# Patient Record
Sex: Female | Born: 1940 | Race: White | Hispanic: No | Smoking: Former smoker
Health system: Southern US, Community
[De-identification: ages and names within clinical notes are randomized; demographics above are authoritative.]

## PROBLEM LIST (undated history)

## (undated) DIAGNOSIS — M1990 Unspecified osteoarthritis, unspecified site: Secondary | ICD-10-CM

## (undated) DIAGNOSIS — T4145XA Adverse effect of unspecified anesthetic, initial encounter: Secondary | ICD-10-CM

## (undated) DIAGNOSIS — K219 Gastro-esophageal reflux disease without esophagitis: Secondary | ICD-10-CM

## (undated) DIAGNOSIS — C801 Malignant (primary) neoplasm, unspecified: Secondary | ICD-10-CM

## (undated) DIAGNOSIS — T8859XA Other complications of anesthesia, initial encounter: Secondary | ICD-10-CM

## (undated) DIAGNOSIS — J189 Pneumonia, unspecified organism: Secondary | ICD-10-CM

## (undated) HISTORY — PX: COLONOSCOPY: SHX174

## (undated) HISTORY — PX: COLONOSCOPY W/ BIOPSIES AND POLYPECTOMY: SHX1376

## (undated) HISTORY — PX: ABDOMINAL HYSTERECTOMY: SHX81

## (undated) HISTORY — PX: LOBECTOMY: SHX5089

## (undated) HISTORY — PX: JOINT REPLACEMENT: SHX530

## (undated) HISTORY — PX: EYE SURGERY: SHX253

## (undated) HISTORY — PX: OTHER SURGICAL HISTORY: SHX169

## (undated) HISTORY — PX: TONSILLECTOMY: SUR1361

---

## 1994-10-03 HISTORY — PX: ABDOMINAL HYSTERECTOMY: SHX81

## 2013-10-09 ENCOUNTER — Other Ambulatory Visit (HOSPITAL_COMMUNITY): Payer: Self-pay | Admitting: Orthopaedic Surgery

## 2013-10-09 ENCOUNTER — Encounter (HOSPITAL_COMMUNITY): Payer: Self-pay | Admitting: *Deleted

## 2013-10-10 ENCOUNTER — Encounter (HOSPITAL_COMMUNITY): Payer: Self-pay | Admitting: Pharmacy Technician

## 2013-10-10 NOTE — Progress Notes (Signed)
SAMEDAY PREOP INSTRUCTIONS AND CHLORHEXIDINE INSTRUCTIONS / PRECAUTIONS DISCUSSED WITH PT BY PHONE.

## 2013-10-11 ENCOUNTER — Inpatient Hospital Stay (HOSPITAL_COMMUNITY): Payer: Medicare Other

## 2013-10-11 ENCOUNTER — Inpatient Hospital Stay (HOSPITAL_COMMUNITY)
Admission: RE | Admit: 2013-10-11 | Discharge: 2013-10-14 | DRG: 470 | Disposition: A | Discharge status: Home Health | Payer: Medicare Other | Source: Ambulatory Visit | Attending: Orthopaedic Surgery | Admitting: Orthopaedic Surgery

## 2013-10-11 ENCOUNTER — Encounter (HOSPITAL_COMMUNITY): Payer: Self-pay | Admitting: *Deleted

## 2013-10-11 ENCOUNTER — Inpatient Hospital Stay (HOSPITAL_COMMUNITY): Payer: Medicare Other | Admitting: Anesthesiology

## 2013-10-11 ENCOUNTER — Encounter (HOSPITAL_COMMUNITY): Payer: Medicare Other | Admitting: Anesthesiology

## 2013-10-11 ENCOUNTER — Encounter (HOSPITAL_COMMUNITY)
Admission: RE | Disposition: A | Discharge status: Home Health | Payer: Self-pay | Source: Ambulatory Visit | Attending: Orthopaedic Surgery

## 2013-10-11 DIAGNOSIS — M879 Osteonecrosis, unspecified: Secondary | ICD-10-CM

## 2013-10-11 DIAGNOSIS — K219 Gastro-esophageal reflux disease without esophagitis: Secondary | ICD-10-CM | POA: Diagnosis present

## 2013-10-11 DIAGNOSIS — Z87891 Personal history of nicotine dependence: Secondary | ICD-10-CM

## 2013-10-11 DIAGNOSIS — M169 Osteoarthritis of hip, unspecified: Principal | ICD-10-CM | POA: Diagnosis present

## 2013-10-11 DIAGNOSIS — Z96649 Presence of unspecified artificial hip joint: Secondary | ICD-10-CM

## 2013-10-11 DIAGNOSIS — M1610 Unilateral primary osteoarthritis, unspecified hip: Principal | ICD-10-CM | POA: Diagnosis present

## 2013-10-11 DIAGNOSIS — M87059 Idiopathic aseptic necrosis of unspecified femur: Secondary | ICD-10-CM | POA: Diagnosis present

## 2013-10-11 DIAGNOSIS — Z85118 Personal history of other malignant neoplasm of bronchus and lung: Secondary | ICD-10-CM

## 2013-10-11 DIAGNOSIS — D62 Acute posthemorrhagic anemia: Secondary | ICD-10-CM | POA: Diagnosis not present

## 2013-10-11 DIAGNOSIS — Z9221 Personal history of antineoplastic chemotherapy: Secondary | ICD-10-CM

## 2013-10-11 DIAGNOSIS — D1739 Benign lipomatous neoplasm of skin and subcutaneous tissue of other sites: Secondary | ICD-10-CM | POA: Diagnosis present

## 2013-10-11 HISTORY — DX: Malignant (primary) neoplasm, unspecified: C80.1

## 2013-10-11 HISTORY — DX: Unspecified osteoarthritis, unspecified site: M19.90

## 2013-10-11 HISTORY — DX: Gastro-esophageal reflux disease without esophagitis: K21.9

## 2013-10-11 HISTORY — DX: Pneumonia, unspecified organism: J18.9

## 2013-10-11 HISTORY — PX: TOTAL HIP ARTHROPLASTY: SHX124

## 2013-10-11 LAB — SURGICAL PCR SCREEN
MRSA, PCR: NEGATIVE
Staphylococcus aureus: NEGATIVE

## 2013-10-11 LAB — URINALYSIS, ROUTINE W REFLEX MICROSCOPIC
BILIRUBIN URINE: NEGATIVE
Glucose, UA: NEGATIVE mg/dL
Ketones, ur: NEGATIVE mg/dL
Nitrite: NEGATIVE
PH: 7 (ref 5.0–8.0)
Protein, ur: NEGATIVE mg/dL
Specific Gravity, Urine: 1.017 (ref 1.005–1.030)
Urobilinogen, UA: 0.2 mg/dL (ref 0.0–1.0)

## 2013-10-11 LAB — BASIC METABOLIC PANEL
BUN: 13 mg/dL (ref 6–23)
CO2: 28 mEq/L (ref 19–32)
Calcium: 9.5 mg/dL (ref 8.4–10.5)
Chloride: 100 mEq/L (ref 96–112)
Creatinine, Ser: 0.75 mg/dL (ref 0.50–1.10)
GFR calc Af Amer: >90 mL/min (ref >90)
GFR calc non Af Amer: 81 mL/min — ABNORMAL LOW (ref >90)
Glucose, Bld: 92 mg/dL (ref 70–99)
POTASSIUM: 3.8 meq/L (ref 3.7–5.3)
Sodium: 139 mEq/L (ref 137–147)

## 2013-10-11 LAB — APTT: APTT: 31 s (ref 24–37)

## 2013-10-11 LAB — CBC
HEMATOCRIT: 36.3 % (ref 36.0–46.0)
Hemoglobin: 12 g/dL (ref 12.0–15.0)
MCH: 31.9 pg (ref 26.0–34.0)
MCHC: 33.1 g/dL (ref 30.0–36.0)
MCV: 96.5 fL (ref 78.0–100.0)
Platelets: 250 10*3/uL (ref 150–400)
RBC: 3.76 MIL/uL — ABNORMAL LOW (ref 3.87–5.11)
RDW: 16.9 % — ABNORMAL HIGH (ref 11.5–15.5)
WBC: 8.5 10*3/uL (ref 4.0–10.5)

## 2013-10-11 LAB — TYPE AND SCREEN
ABO/RH(D): A POS
Antibody Screen: NEGATIVE

## 2013-10-11 LAB — ABO/RH: ABO/RH(D): A POS

## 2013-10-11 LAB — URINE MICROSCOPIC-ADD ON

## 2013-10-11 LAB — PROTIME-INR
INR: 1.12 (ref 0.00–1.49)
Prothrombin Time: 14.2 seconds (ref 11.6–15.2)

## 2013-10-11 SURGERY — ARTHROPLASTY, HIP, TOTAL, ANTERIOR APPROACH
Anesthesia: Spinal | Site: Hip | Laterality: Right

## 2013-10-11 MED ORDER — DIPHENHYDRAMINE HCL 12.5 MG/5ML PO ELIX: 12.5000 mg | ORAL_SOLUTION | ORAL | Status: DC | PRN

## 2013-10-11 MED ORDER — LACTATED RINGERS IV SOLN
INTRAVENOUS | Status: DC | PRN
  Administered 2013-10-11 (×2): via INTRAVENOUS

## 2013-10-11 MED ORDER — ONDANSETRON HCL 4 MG/2ML IJ SOLN
INTRAMUSCULAR | Status: AC
  Filled 2013-10-11: qty 2

## 2013-10-11 MED ORDER — PROMETHAZINE HCL 25 MG/ML IJ SOLN: 6.2500 mg | INTRAMUSCULAR | Status: DC | PRN

## 2013-10-11 MED ORDER — EPHEDRINE SULFATE 50 MG/ML IJ SOLN
INTRAMUSCULAR | Status: AC
  Filled 2013-10-11: qty 1

## 2013-10-11 MED ORDER — CEFAZOLIN SODIUM-DEXTROSE 2-3 GM-% IV SOLR
INTRAVENOUS | Status: AC
  Filled 2013-10-11: qty 50

## 2013-10-11 MED ORDER — PROPOFOL 10 MG/ML IV BOLUS
INTRAVENOUS | Status: AC
  Filled 2013-10-11: qty 20

## 2013-10-11 MED ORDER — CIPROFLOXACIN IN D5W 400 MG/200ML IV SOLN
INTRAVENOUS | Status: AC
  Filled 2013-10-11: qty 200

## 2013-10-11 MED ORDER — ALUM & MAG HYDROXIDE-SIMETH 200-200-20 MG/5ML PO SUSP: 30.0000 mL | ORAL | Status: DC | PRN

## 2013-10-11 MED ORDER — ZOLPIDEM TARTRATE 5 MG PO TABS: 5.0000 mg | ORAL_TABLET | Freq: Every evening | ORAL | Status: DC | PRN

## 2013-10-11 MED ORDER — METHOCARBAMOL 500 MG PO TABS
500.0000 mg | ORAL_TABLET | Freq: Four times a day (QID) | ORAL | Status: DC | PRN
  Administered 2013-10-12 – 2013-10-13 (×4): 500 mg via ORAL
  Filled 2013-10-11 (×5): qty 1

## 2013-10-11 MED ORDER — OXYCODONE HCL 5 MG/5ML PO SOLN: 5.0000 mg | Freq: Once | ORAL | Status: DC | PRN

## 2013-10-11 MED ORDER — BUPIVACAINE HCL (PF) 0.5 % IJ SOLN
INTRAMUSCULAR | Status: AC
  Filled 2013-10-11: qty 30

## 2013-10-11 MED ORDER — FENTANYL CITRATE 0.05 MG/ML IJ SOLN
INTRAMUSCULAR | Status: AC
  Filled 2013-10-11: qty 2

## 2013-10-11 MED ORDER — LEVOFLOXACIN 500 MG PO TABS
500.0000 mg | ORAL_TABLET | Freq: Every day | ORAL | Status: DC
  Administered 2013-10-11 – 2013-10-14 (×4): 500 mg via ORAL
  Filled 2013-10-11 (×5): qty 1

## 2013-10-11 MED ORDER — LACTATED RINGERS IV SOLN
INTRAVENOUS | Status: DC
  Administered 2013-10-11: 1 via INTRAVENOUS

## 2013-10-11 MED ORDER — BUPIVACAINE HCL (PF) 0.5 % IJ SOLN
INTRAMUSCULAR | Status: DC | PRN
  Administered 2013-10-11: 3 mL

## 2013-10-11 MED ORDER — MEPERIDINE HCL 50 MG/ML IJ SOLN: 6.2500 mg | INTRAMUSCULAR | Status: DC | PRN

## 2013-10-11 MED ORDER — PHENOL 1.4 % MT LIQD
1.0000 | OROMUCOSAL | Status: DC | PRN
  Filled 2013-10-11: qty 177

## 2013-10-11 MED ORDER — ACETAMINOPHEN 650 MG RE SUPP: 650.0000 mg | Freq: Four times a day (QID) | RECTAL | Status: DC | PRN

## 2013-10-11 MED ORDER — EPHEDRINE SULFATE 50 MG/ML IJ SOLN
INTRAMUSCULAR | Status: DC | PRN
  Administered 2013-10-11 (×2): 5 mg via INTRAVENOUS

## 2013-10-11 MED ORDER — ONDANSETRON HCL 4 MG PO TABS: 4.0000 mg | ORAL_TABLET | Freq: Four times a day (QID) | ORAL | Status: DC | PRN

## 2013-10-11 MED ORDER — SODIUM CHLORIDE 0.9 % IR SOLN
Status: DC | PRN
  Administered 2013-10-11: 1000 mL

## 2013-10-11 MED ORDER — POLYETHYLENE GLYCOL 3350 17 G PO PACK
17.0000 g | PACK | Freq: Every day | ORAL | Status: DC | PRN
Start: 2013-10-11 — End: 2013-10-14
  Administered 2013-10-12: 07:00:00 17 g via ORAL

## 2013-10-11 MED ORDER — CEFAZOLIN SODIUM-DEXTROSE 2-3 GM-% IV SOLR
2.0000 g | INTRAVENOUS | Status: AC
  Administered 2013-10-11: 2 g via INTRAVENOUS

## 2013-10-11 MED ORDER — METOCLOPRAMIDE HCL 10 MG PO TABS: 5.0000 mg | ORAL_TABLET | Freq: Three times a day (TID) | ORAL | Status: DC | PRN

## 2013-10-11 MED ORDER — CEFAZOLIN SODIUM 1-5 GM-% IV SOLN
1.0000 g | Freq: Four times a day (QID) | INTRAVENOUS | Status: AC
  Administered 2013-10-11 – 2013-10-12 (×2): 1 g via INTRAVENOUS
  Filled 2013-10-11 (×2): qty 50

## 2013-10-11 MED ORDER — OXYCODONE HCL ER 20 MG PO T12A
20.0000 mg | EXTENDED_RELEASE_TABLET | Freq: Two times a day (BID) | ORAL | Status: DC
  Administered 2013-10-11 – 2013-10-14 (×6): 20 mg via ORAL
  Filled 2013-10-11 (×6): qty 1

## 2013-10-11 MED ORDER — OXYCODONE HCL 5 MG PO TABS: 5.0000 mg | ORAL_TABLET | Freq: Once | ORAL | Status: DC | PRN

## 2013-10-11 MED ORDER — HYDROMORPHONE HCL PF 1 MG/ML IJ SOLN
1.0000 mg | INTRAMUSCULAR | Status: DC | PRN
  Administered 2013-10-11 – 2013-10-12 (×2): 1 mg via INTRAVENOUS
  Filled 2013-10-11 (×2): qty 1

## 2013-10-11 MED ORDER — TRANEXAMIC ACID 100 MG/ML IV SOLN
1000.0000 mg | INTRAVENOUS | Status: AC
  Administered 2013-10-11: 1000 mg via INTRAVENOUS
  Filled 2013-10-11: qty 10

## 2013-10-11 MED ORDER — CIPROFLOXACIN IN D5W 400 MG/200ML IV SOLN
INTRAVENOUS | Status: DC | PRN
  Administered 2013-10-11: 400 mg via INTRAVENOUS

## 2013-10-11 MED ORDER — ONDANSETRON HCL 4 MG/2ML IJ SOLN: 4.0000 mg | Freq: Four times a day (QID) | INTRAMUSCULAR | Status: DC | PRN

## 2013-10-11 MED ORDER — ASPIRIN EC 325 MG PO TBEC
325.0000 mg | DELAYED_RELEASE_TABLET | Freq: Two times a day (BID) | ORAL | Status: DC
  Administered 2013-10-12 – 2013-10-14 (×5): 325 mg via ORAL
  Filled 2013-10-11 (×7): qty 1

## 2013-10-11 MED ORDER — HYDROMORPHONE HCL PF 1 MG/ML IJ SOLN: 0.2500 mg | INTRAMUSCULAR | Status: DC | PRN

## 2013-10-11 MED ORDER — SODIUM CHLORIDE 0.9 % IJ SOLN
INTRAMUSCULAR | Status: AC
  Filled 2013-10-11: qty 10

## 2013-10-11 MED ORDER — OXYCODONE HCL 5 MG PO TABS
5.0000 mg | ORAL_TABLET | ORAL | Status: DC | PRN
  Administered 2013-10-11: 5 mg via ORAL
  Administered 2013-10-11 – 2013-10-13 (×4): 10 mg via ORAL
  Administered 2013-10-14: 5 mg via ORAL
  Filled 2013-10-11: qty 1
  Filled 2013-10-11 (×3): qty 2
  Filled 2013-10-11: qty 1
  Filled 2013-10-11: qty 2

## 2013-10-11 MED ORDER — METHOCARBAMOL 100 MG/ML IJ SOLN
500.0000 mg | Freq: Four times a day (QID) | INTRAVENOUS | Status: DC | PRN
  Administered 2013-10-11: 500 mg via INTRAVENOUS
  Filled 2013-10-11: qty 5

## 2013-10-11 MED ORDER — PROPOFOL INFUSION 10 MG/ML OPTIME
INTRAVENOUS | Status: DC | PRN
  Administered 2013-10-11: 75 ug/kg/min via INTRAVENOUS

## 2013-10-11 MED ORDER — ONDANSETRON HCL 4 MG/2ML IJ SOLN
INTRAMUSCULAR | Status: DC | PRN
  Administered 2013-10-11: 4 mg via INTRAVENOUS

## 2013-10-11 MED ORDER — METOCLOPRAMIDE HCL 5 MG/ML IJ SOLN: 5.0000 mg | Freq: Three times a day (TID) | INTRAMUSCULAR | Status: DC | PRN

## 2013-10-11 MED ORDER — ACETAMINOPHEN 325 MG PO TABS
650.0000 mg | ORAL_TABLET | Freq: Four times a day (QID) | ORAL | Status: DC | PRN
Start: 2013-10-11 — End: 2013-10-14
  Administered 2013-10-12: 650 mg via ORAL
  Filled 2013-10-11: qty 2

## 2013-10-11 MED ORDER — SODIUM CHLORIDE 0.9 % IV SOLN
INTRAVENOUS | Status: DC
Start: 2013-10-11 — End: 2013-10-13
  Administered 2013-10-12: 07:00:00 via INTRAVENOUS

## 2013-10-11 MED ORDER — MUPIROCIN 2 % EX OINT
TOPICAL_OINTMENT | Freq: Two times a day (BID) | CUTANEOUS | Status: DC
  Administered 2013-10-11: 1 via NASAL
  Filled 2013-10-11: qty 22

## 2013-10-11 MED ORDER — FENTANYL CITRATE 0.05 MG/ML IJ SOLN
INTRAMUSCULAR | Status: DC | PRN
  Administered 2013-10-11: 50 ug via INTRAVENOUS

## 2013-10-11 MED ORDER — DOCUSATE SODIUM 100 MG PO CAPS
100.0000 mg | ORAL_CAPSULE | Freq: Two times a day (BID) | ORAL | Status: DC
  Administered 2013-10-11 – 2013-10-14 (×6): 100 mg via ORAL

## 2013-10-11 MED ORDER — PROPOFOL 10 MG/ML IV BOLUS
INTRAVENOUS | Status: DC | PRN
  Administered 2013-10-11: 10 mg via INTRAVENOUS
  Administered 2013-10-11: 20 mg via INTRAVENOUS

## 2013-10-11 MED ORDER — MENTHOL 3 MG MT LOZG
1.0000 | LOZENGE | OROMUCOSAL | Status: DC | PRN
  Filled 2013-10-11: qty 9

## 2013-10-11 SURGICAL SUPPLY — 43 items
BAG ZIPLOCK 12X15 (MISCELLANEOUS) IMPLANT
BENZOIN TINCTURE PRP APPL 2/3 (GAUZE/BANDAGES/DRESSINGS) ×3 IMPLANT
BLADE SAW SGTL 18X1.27X75 (BLADE) ×2 IMPLANT
BLADE SAW SGTL 18X1.27X75MM (BLADE) ×1
CAPT HIP PF COP ×3 IMPLANT
CELLS DAT CNTRL 66122 CELL SVR (MISCELLANEOUS) ×1 IMPLANT
CLOSURE WOUND 1/2 X4 (GAUZE/BANDAGES/DRESSINGS) ×1
DERMABOND ADVANCED (GAUZE/BANDAGES/DRESSINGS)
DERMABOND ADVANCED .7 DNX12 (GAUZE/BANDAGES/DRESSINGS) IMPLANT
DRAPE C-ARM 42X120 X-RAY (DRAPES) ×3 IMPLANT
DRAPE STERI IOBAN 125X83 (DRAPES) ×3 IMPLANT
DRAPE U-SHAPE 47X51 STRL (DRAPES) ×9 IMPLANT
DRSG AQUACEL AG ADV 3.5X10 (GAUZE/BANDAGES/DRESSINGS) ×3 IMPLANT
DURAPREP 26ML APPLICATOR (WOUND CARE) ×3 IMPLANT
ELECT BLADE TIP CTD 4 INCH (ELECTRODE) ×3 IMPLANT
ELECT REM PT RETURN 9FT ADLT (ELECTROSURGICAL) ×3
ELECTRODE REM PT RTRN 9FT ADLT (ELECTROSURGICAL) ×1 IMPLANT
FACESHIELD LNG OPTICON STERILE (SAFETY) ×12 IMPLANT
GAUZE XEROFORM 5X9 LF (GAUZE/BANDAGES/DRESSINGS) IMPLANT
GLOVE BIO SURGEON STRL SZ7.5 (GLOVE) ×18 IMPLANT
GLOVE BIOGEL PI IND STRL 8 (GLOVE) ×2 IMPLANT
GLOVE BIOGEL PI INDICATOR 8 (GLOVE) ×4
GLOVE ECLIPSE 8.0 STRL XLNG CF (GLOVE) ×3 IMPLANT
GOWN STRL REUS W/TWL XL LVL3 (GOWN DISPOSABLE) ×15 IMPLANT
HANDPIECE INTERPULSE COAX TIP (DISPOSABLE) ×2
KIT BASIN OR (CUSTOM PROCEDURE TRAY) ×3 IMPLANT
PACK TOTAL JOINT (CUSTOM PROCEDURE TRAY) ×3 IMPLANT
PADDING CAST COTTON 6X4 STRL (CAST SUPPLIES) ×3 IMPLANT
RTRCTR WOUND ALEXIS 18CM MED (MISCELLANEOUS) ×3
SCREW PINN CAN 6.5X20 (Screw) IMPLANT
SET HNDPC FAN SPRY TIP SCT (DISPOSABLE) ×1 IMPLANT
STAPLER VISISTAT 35W (STAPLE) IMPLANT
STRIP CLOSURE SKIN 1/2X4 (GAUZE/BANDAGES/DRESSINGS) ×2 IMPLANT
SUT ETHIBOND NAB CT1 #1 30IN (SUTURE) ×3 IMPLANT
SUT ETHILON 3 0 PS 1 (SUTURE) IMPLANT
SUT MNCRL AB 4-0 PS2 18 (SUTURE) ×3 IMPLANT
SUT VIC AB 0 CT1 36 (SUTURE) ×3 IMPLANT
SUT VIC AB 1 CT1 36 (SUTURE) ×3 IMPLANT
SUT VIC AB 2-0 CT1 27 (SUTURE) ×4
SUT VIC AB 2-0 CT1 TAPERPNT 27 (SUTURE) ×2 IMPLANT
TOWEL OR 17X26 10 PK STRL BLUE (TOWEL DISPOSABLE) ×3 IMPLANT
TOWEL OR NON WOVEN STRL DISP B (DISPOSABLE) ×3 IMPLANT
TRAY FOLEY CATH 14FRSI W/METER (CATHETERS) ×3 IMPLANT

## 2013-10-11 NOTE — Anesthesia Procedure Notes (Addendum)
Spinal  Patient location during procedure: OR Start time: 10/11/2013 1:35 PM End time: 10/11/2013 1:41 PM Staffing CRNA/Resident: Anne Fu Performed by: resident/CRNA  Preanesthetic Checklist Completed: patient identified, site marked, surgical consent, pre-op evaluation, timeout performed, IV checked, risks and benefits discussed and monitors and equipment checked Spinal Block Patient position: sitting Prep: Betadine Patient monitoring: heart rate, continuous pulse ox and blood pressure Approach: right paramedian Location: L2-3 Injection technique: single-shot Needle Needle type: Spinocan  Needle gauge: 22 G Needle length: 9 cm Assessment Sensory level: T4 Additional Notes Expiration date of kit checked and confirmed. Patient tolerated procedure well, without complications. X1 Attempt with noted clear CSF return, easy administration of medication.  Noted T-4 level on exam prior to procedure.

## 2013-10-11 NOTE — Transfer of Care (Signed)
Immediate Anesthesia Transfer of Care Note  Patient: Lisa Sherman  Procedure(s) Performed: Procedure(s) (LRB): RIGHT TOTAL HIP ARTHROPLASTY ANTERIOR APPROACH (Right)  Patient Location: PACU  Anesthesia Type: Spinal  Level of Consciousness: sedated, patient cooperative and responds to stimulation  Airway & Oxygen Therapy: Patient Spontanous Breathing and Patient connected to face mask oxgen  Post-op Assessment: Report given to PACU RN and Post -op Vital signs reviewed and stable  Post vital signs: Reviewed and stable  Complications: No apparent anesthesia complications

## 2013-10-11 NOTE — H&P (Signed)
TOTAL HIP ADMISSION H&P  Patient is admitted for right total hip arthroplasty.  Subjective:  Chief Complaint: right hip pain  HPI: Lisa Sherman, 73 y.o. female, has a history of pain and functional disability in the right hip(s) due to arthritis and patient has failed non-surgical conservative treatments for greater than 12 weeks to include NSAID's and/or analgesics, supervised PT with diminished ADL's post treatment, use of assistive devices and activity modification.  Onset of symptoms was abrupt starting 1 years ago with rapidlly worsening course since that time.The patient noted no past surgery on the right hip(s).  Patient currently rates pain in the right hip at 10 out of 10 with activity. Patient has night pain, worsening of pain with activity and weight bearing, trendelenberg gait, pain that interfers with activities of daily living and pain with passive range of motion. Patient has evidence of subchondral cysts, subchondral sclerosis, periarticular osteophytes and joint space narrowing by imaging studies. This condition presents safety issues increasing the risk of falls.  There is no current active infection.  Patient Active Problem List   Diagnosis Date Noted  . Osteonecrosis of right hip 10/11/2013   Past Medical History  Diagnosis Date  . Cancer     hx of lung cancer - surgery - chemo - radiation- all completed july 2009  . Pneumonia     yrs ago  . GERD (gastroesophageal reflux disease)   . Arthritis     severe oa right hip; also has arthritis both shoulders-limited rom.    Past Surgical History  Procedure Laterality Date  . Removal upper right lobe lung for cancer -2009 - surgery at [facility]    . 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 Abdominal hysterectomy  1996    No prescriptions prior to admission   No Known Allergies  History  Substance Use Topics  . Smoking status: Former Research scientist (life sciences)  . Smokeless tobacco: Never Used  . Alcohol  Use: Yes     Comment: quit smoking 1975  occas alcohol    History reviewed. No pertinent family history.   Review of Systems  Musculoskeletal: Positive for joint pain.  All other systems reviewed and are negative.    Objective:  Physical Exam  Constitutional: She is oriented to person, place, and time. She appears well-developed and well-nourished.  HENT:  Head: Normocephalic and atraumatic.  Eyes: EOM are normal. Pupils are equal, round, and reactive to light.  Neck: Normal range of motion. Neck supple.  Cardiovascular: Normal rate and regular rhythm.   Respiratory: Effort normal and breath sounds normal.  GI: Soft. Bowel sounds are normal.  Musculoskeletal:       Right hip: She exhibits decreased range of motion, decreased strength and bony tenderness.  Neurological: She is alert and oriented to person, place, and time.  Skin: Skin is warm and dry.  Psychiatric: She has a normal mood and affect.    Vital signs in last 24 hours:    Labs:   There is no height or weight on file to calculate BMI.   Imaging Review Plain radiographs demonstrate severe degenerative joint disease of the right hip(s). The bone quality appears to be good for age and reported activity level.  Assessment/Plan:  End stage arthritis, right hip(s)  The patient history, physical examination, clinical judgement of the provider and imaging studies are consistent with end stage degenerative joint disease of the right hip(s) and total hip arthroplasty is deemed  medically necessary. The treatment options including medical management, injection therapy, arthroscopy and arthroplasty were discussed at length. The risks and benefits of total hip arthroplasty were presented and reviewed. The risks due to aseptic loosening, infection, stiffness, dislocation/subluxation,  thromboembolic complications and other imponderables were discussed.  The patient acknowledged the explanation, agreed to proceed with the plan  and consent was signed. Patient is being admitted for inpatient treatment for surgery, pain control, PT, OT, prophylactic antibiotics, VTE prophylaxis, progressive ambulation and ADL's and discharge planning.The patient is planning to be discharged home with home health services

## 2013-10-11 NOTE — Preoperative (Signed)
Beta Blockers   Reason not to administer Beta Blockers:Not Applicable 

## 2013-10-11 NOTE — Brief Op Note (Signed)
10/11/2013  3:48 PM  PATIENT:  Jamse Arn  73 y.o. female  PRE-OPERATIVE DIAGNOSIS:  Severe osteoarthritis right hip  POST-OPERATIVE DIAGNOSIS:  severe osteoarthritis right hip  PROCEDURE:  Procedure(s): RIGHT TOTAL HIP ARTHROPLASTY ANTERIOR APPROACH (Right)  SURGEON:  Surgeon(s) and Role:    * Mcarthur Rossetti, MD - Primary  PHYSICIAN ASSISTANT: Benita Stabile, PA-C  ANESTHESIA:   spinal  EBL:  Total I/O In: 1800 [I.V.:1800] Out: 750 [Urine:400; Blood:350]  BLOOD ADMINISTERED:none  DRAINS: none   LOCAL MEDICATIONS USED:  NONE  SPECIMEN:  Excision  DISPOSITION OF SPECIMEN:  PATHOLOGY  COUNTS: yes  TOURNIQUET:  * No tourniquets in log *  DICTATION: .Other Dictation: Dictation Number [id number]  PLAN OF CARE: Admit to inpatient   PATIENT DISPOSITION:  PACU - hemodynamically stable.   Delay start of Pharmacological VTE agent (>24hrs) due to surgical blood loss or risk of bleeding: no

## 2013-10-11 NOTE — Anesthesia Preprocedure Evaluation (Signed)
Anesthesia Evaluation  Patient identified by MRN, date of birth, ID band Patient awake    Reviewed: Allergy & Precautions, H&P , NPO status , Patient's Chart, lab work & pertinent test results  Airway Mallampati: II TM Distance: >3 FB Neck ROM: Full    Dental  (+) Dental Advisory Given   Pulmonary pneumonia -, resolved, former smoker,  breath sounds clear to auscultation        Cardiovascular negative cardio ROS  Rhythm:Regular Rate:Normal     Neuro/Psych negative neurological ROS  negative psych ROS   GI/Hepatic Neg liver ROS, GERD-  ,  Endo/Other  negative endocrine ROS  Renal/GU negative Renal ROS     Musculoskeletal negative musculoskeletal ROS (+)   Abdominal   Peds  Hematology negative hematology ROS (+)   Anesthesia Other Findings   Reproductive/Obstetrics negative OB ROS                           Anesthesia Physical Anesthesia Plan  ASA: II  Anesthesia Plan: Spinal   Post-op Pain Management:    Induction:   Airway Management Planned:   Additional Equipment:   Intra-op Plan:   Post-operative Plan:   Informed Consent: I have reviewed the patients History and Physical, chart, labs and discussed the procedure including the risks, benefits and alternatives for the proposed anesthesia with the patient or authorized representative who has indicated his/her understanding and acceptance.   Dental advisory given  Plan Discussed with: CRNA  Anesthesia Plan Comments:         Anesthesia Quick Evaluation

## 2013-10-11 NOTE — Progress Notes (Signed)
Patient took [place] 10/10/13 for UTI

## 2013-10-12 LAB — URINE CULTURE
CULTURE: NO GROWTH
Colony Count: NO GROWTH

## 2013-10-12 LAB — BASIC METABOLIC PANEL
BUN: 11 mg/dL (ref 6–23)
CHLORIDE: 101 meq/L (ref 96–112)
CO2: 27 mEq/L (ref 19–32)
Calcium: 8.3 mg/dL — ABNORMAL LOW (ref 8.4–10.5)
Creatinine, Ser: 0.7 mg/dL (ref 0.50–1.10)
GFR calc non Af Amer: 83 mL/min — ABNORMAL LOW (ref >90)
Glucose, Bld: 113 mg/dL — ABNORMAL HIGH (ref 70–99)
Potassium: 3.7 mEq/L (ref 3.7–5.3)
Sodium: 138 mEq/L (ref 137–147)

## 2013-10-12 LAB — CBC
HCT: 31.3 % — ABNORMAL LOW (ref 36.0–46.0)
Hemoglobin: 10 g/dL — ABNORMAL LOW (ref 12.0–15.0)
MCH: 31.4 pg (ref 26.0–34.0)
MCHC: 31.9 g/dL (ref 30.0–36.0)
MCV: 98.4 fL (ref 78.0–100.0)
PLATELETS: 201 10*3/uL (ref 150–400)
RBC: 3.18 MIL/uL — ABNORMAL LOW (ref 3.87–5.11)
RDW: 16.6 % — ABNORMAL HIGH (ref 11.5–15.5)
WBC: 9.9 10*3/uL (ref 4.0–10.5)

## 2013-10-12 NOTE — Op Note (Signed)
Lisa Sherman, Lisa Sherman                ACCOUNT NO.:  [id number]  MEDICAL RECORD NO.:  [id number]  LOCATION:  71                         FACILITY:  [facility]  PHYSICIAN:  Lind Guest. Ninfa Linden, M.D.DATE OF BIRTH:  April 08, 1941  DATE OF PROCEDURE:  10/11/2013 DATE OF DISCHARGE:                              OPERATIVE REPORT   PREOPERATIVE DIAGNOSIS:  Right hip osteoarthritis with osteonecrosis.  POSTOPERATIVE DIAGNOSIS:  Right hip osteoarthritis with osteonecrosis.  PROCEDURE:  Right total hip arthroplasty through direct anterior approach.  IMPLANTS:  DePuy Sector Gription acetabular component size 50 with apex hole eliminator guide and a single screw, size 32+ 4 neutral polyethylene liner, size 10 Corail femoral component with varus offset (KLA), size 32 + 1 ceramic hip ball.  SURGEON:  Jean Rosenthal, M.D.  ASSISTANT:  Erskine Emery, PA-C.  ANESTHESIA:  Spinal.  BLOOD LOSS:  350 mL.  ANTIBIOTICS:  2 g of IV Ancef and 400 mg of IV Cipro due to white blood cells in her urine.  COMPLICATIONS:  None.  INDICATIONS:  Ms. Havlicek is a very pleasant 73 year old female with worsening hip pain over a year and a half of her right hip.  X-ray show severe end-stage arthritis of that hip as well as level of osteonecrosis.  She has gotten to the point where it is greatly affecting her mobility and her activities of daily living.  She is ambulating with a walker now and has tried all modes of treatment prior to wishing for hip arthroplasty.  Her quality of life is very limited at this point and her pain is daily.  She wished for total hip arthroplasty.  She understands the risk of acute blood loss anemia, nerve or vessel injury, fracture, infection, dislocation, and DVT.  She understands the goals are decreased pain, improved mobility, and overall improved quality of life.  Prior to surgery, she did show me an area near where her incision would be of hypermobile mass in the  soft tissues.  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  I did let her know that if we could get to this area through her incision that we could address it and remove and sent it to Pathology, but if it was not near enough to my incision that we would not, and she understands this as well.  She does give informed consent for surgery.  PROCEDURE DESCRIPTION:  After informed consent was obtained, the appropriate right leg was marked.  She was brought to the operating room, and sat up on her stretcher, so spinal anesthesia could be obtained.  She was then placed in a supine position with a perineal post in place and both legs in inline skeletal traction devices, but no traction applied.  Her right operative hip was then prepped and draped with DuraPrep and sterile drapes.  A time-out was called and she was identified as correct patient, correct right hip.  I then made an incision just inferior and posterior to the anterior iliac spine and carried this through the superficial tissues.  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  I did pass this off to the back table for permanent identification to Pathology, but again it seemed consistent with a lipoma visually.  We then proceeded with remainder of our surgery.  We dissected down to the tensor fascia lata and the tensor fascia was then divided longitudinally so we could proceed with a direct anterior approach.  A Cobra retractor was then placed around the lateral neck and up underneath the rectus femoris.  Cobra retractor was placed medially.  I cauterized the lateral femoral circumflex vessels and then opened up the hip capsule in a L- type format finding a large hip effusion.  I was able to place the Cobra retractors within the hip capsule and expose the femoral head and neck. I then made my femoral  neck cut just proximal to the lesser trochanter with an oscillating saw and completed this on osteotome.  I was able to place a corkscrew guide in the femoral head and remove the femoral head in its entirety and found it completely devoid of cartilage with areas of collapse consistent with both osteoarthritis and osteonecrosis.  I then cleaned the acetabulum of debris and placed a bent Hohmann in the medial side of the acetabulum and a Cobra retractor laterally.  I removed remnants of acetabular labrum, then began reaming in 2 mm increments from a size 42 all the way up to a size 50.  All reamers were placed under direct visualization and the last reamer under direct fluoroscopy as well so we could obtain our depth of reaming and inclination, and anteversion.  Once I was pleased with this, I placed a real DePuy Sector Gription acetabular component size 50, the apex hole eliminator guide, and a single screw.  I then placed the real 32 + 4 neutral polyethylene liner.  Attention was then turned to the femur. With the leg externally rotated to 100 degrees, extended and adducted, I was able to gain access to the femoral canal using a box cutting osteotome.  I then used a rongeur to lateralize and I was able to then began broaching after releasing the lateral joint capsule.  I used a size 8 broach up to a size 10.  Size 10 was felt to be stable based on her anatomy and my thought that she may end up being long, I trialed a varus offset neck and a 32 + 1 hip ball.  We brought the leg back over and up and with traction and internal rotation was able to reach into the pelvis.  We assessed this under fluoroscopy and her leg lengths were measured equal.  I had a good fit and fill of the stem and the acetabular component and I could rotate her past 90 degrees externally and she was still stable.  She had minimal shuck as well.  I then dislocated the hip and removed the trial components and then placed  the real Corail femoral component size 10 with varus offset and the real 32+ 1 ceramic hip ball.  We reduced this again into the acetabulum and it was stable.  We copiously irrigated the soft tissue with normal saline solution and then closed the joint capsule with interrupted #1 Ethibond suture.  We closed the deep tissue with 0-Vicryl followed by 2-0 Vicryl in the subcutaneous tissue.  Prior to this, we closed the tensor fascia with a running #1 Vicryl on the subcutaneous tissue and subcuticular tissue we placed 2-0 Vicryl followed by  4-0 Monocryl and then Steri- Strips and Aquacel dressing was applied.  She was taken off the HANA table.  Her leg lengths were felt to be equal.  She was taken to recovery room in stable condition.  All final counts were correct. There were no complications noted.  Of note, Erskine Emery, PA-C was present during the entire case.  His presence was crucial for getting this case completed including patient positioning, retracting, and closure.     Lind Guest. Ninfa Linden, M.D.     CYB/MEDQ  D:  10/11/2013  T:  10/12/2013  Job:  [id number]

## 2013-10-12 NOTE — Progress Notes (Signed)
Physical Therapy Treatment Patient Details Name: Lisa Sherman MRN: [id number] DOB: 07-May-1941 Today's Date: 10/12/2013 Time: 3546-5681 PT Time Calculation (min): 21 min  PT Assessment / Plan / Recommendation  History of Present Illness     PT Comments     Follow Up Recommendations  Home health PT     Does the patient have the potential to tolerate intense rehabilitation     Barriers to Discharge        Equipment Recommendations  None recommended by PT    Recommendations for Other Services OT consult  Frequency 7X/week   Progress towards PT Goals Progress towards PT goals: Progressing toward goals  Plan Current plan remains appropriate    Precautions / Restrictions Precautions Precautions: Fall;Other (comment) Precaution Comments: Ltd ROM bilat shoulders Restrictions Weight Bearing Restrictions: No Other Position/Activity Restrictions: WBAT   Pertinent Vitals/Pain     Mobility  Bed Mobility Overal bed mobility: +2 for physical assistance;Needs Assistance Bed Mobility: Sit to Supine Sit to supine: +2 for physical assistance General bed mobility comments: cues for sequence and use of  L LE to self assist; physical assist for Bil LEs and to control descent of trunk.   Transfers Overall transfer level: Needs assistance Equipment used: Rolling walker (2 wheeled) Transfers: Sit to/from Stand Sit to Stand: +2 physical assistance;Mod assist General transfer comment: cues for LE management and use of UEs to self assist Ambulation/Gait Ambulation/Gait assistance: +2 safety/equipment;Mod assist Ambulation Distance (Feet): 74 Feet Assistive device: Rolling walker (2 wheeled) Gait Pattern/deviations: Step-to pattern;Decreased step length - right;Decreased step length - left;Shuffle;Trunk flexed Gait velocity: decr General Gait Details: cues for sequence, posture and position from RW    Exercises     PT Diagnosis:    PT Problem List:   PT Treatment Interventions:      PT Goals (current goals can now be found in the care plan section) Acute Rehab PT Goals Patient Stated Goal: Walk without pain PT Goal Formulation: With patient Time For Goal Achievement: 10/18/13 Potential to Achieve Goals: Good  Visit Information  Last PT Received On: 10/12/13 Assistance Needed: +2    Subjective Data  Patient Stated Goal: Walk without pain   Cognition  Cognition Arousal/Alertness: Awake/alert Behavior During Therapy: WFL for tasks assessed/performed Overall Cognitive Status: Within Functional Limits for tasks assessed    Balance     End of Session PT - End of Session Equipment Utilized During Treatment: Gait belt Activity Tolerance: Patient tolerated treatment well Patient left: in bed;with call bell/phone within reach;with family/visitor present Nurse Communication: Mobility status   GP     Lisa Sherman 10/12/2013, 5:12 PM

## 2013-10-12 NOTE — Progress Notes (Signed)
Subjective: 1 Day Post-Op Procedure(s) (LRB): RIGHT TOTAL HIP ARTHROPLASTY ANTERIOR APPROACH (Right) Patient reports pain as moderate.  Asymptomatic acute blood loss anemia.  Objective: Vital signs in last 24 hours: Temp:  [97.3 F (36.3 C)-98.7 F (37.1 C)] 98.2 F (36.8 C) (01/10 0523) Pulse Rate:  [50-65] 65 (01/10 0523) Resp:  [12-17] 16 (01/10 0523) BP: (91-142)/(53-87) 103/61 mmHg (01/10 0523) SpO2:  [99 %-100 %] 99 % (01/10 0523) Weight:  [65.772 kg (145 lb)] 65.772 kg (145 lb) (01/09 1650)  Intake/Output from previous day: 01/09 0701 - 01/10 0700 In: 3117.5 [P.O.:120; I.V.:2947.5; IV Piggyback:50] Out: 2075 [Urine:1725; Blood:350] Intake/Output this shift: Total I/O In: -  Out: 800 [Urine:800]   Recent Labs  10/11/13 1025 10/12/13 0512  HGB 12.0 10.0*    Recent Labs  10/11/13 1025 10/12/13 0512  WBC 8.5 9.9  RBC 3.76* 3.18*  HCT 36.3 31.3*  PLT 250 201    Recent Labs  10/11/13 1025 10/12/13 0512  NA 139 138  K 3.8 3.7  CL 100 101  CO2 28 27  BUN 13 11  CREATININE 0.75 0.70  GLUCOSE 92 113*  CALCIUM 9.5 8.3*    Recent Labs  10/11/13 1025  INR 1.12    Sensation intact distally Intact pulses distally Dorsiflexion/Plantar flexion intact Incision: dressing C/D/I  Assessment/Plan: 1 Day Post-Op Procedure(s) (LRB): RIGHT TOTAL HIP ARTHROPLASTY ANTERIOR APPROACH (Right) Up with therapy  Zimir Kittleson Y 10/12/2013, 10:05 AM

## 2013-10-12 NOTE — Evaluation (Signed)
Physical Therapy Evaluation Patient Details Name: Lisa Sherman MRN: [id number] DOB: [birth year] Today's Date: 10/12/2013 Time: 6269-4854 PT Time Calculation (min): 44 min  PT Assessment / Plan / Recommendation History of Present Illness     Clinical Impression  Pt s/p R THR presents with decreased R LE strength/ROM, ltd Bil shoulder ROM and post op pain limiting functional mobility.  Pt should progress to d/c home with 24/7 assist and HHPT follow up    PT Assessment  Patient needs continued PT services    Follow Up Recommendations  Home health PT    Does the patient have the potential to tolerate intense rehabilitation      Barriers to Discharge        Equipment Recommendations  None recommended by PT    Recommendations for Other Services OT consult   Frequency 7X/week    Precautions / Restrictions Precautions Precautions: Fall;Other (comment) Precaution Comments: Ltd ROM bilat shoulders Restrictions Weight Bearing Restrictions: No Other Position/Activity Restrictions: WBAT   Pertinent Vitals/Pain 6/10; premed, ice pack provided      Mobility  Bed Mobility Overal bed mobility: +2 for physical assistance General bed mobility comments: cues for sequence and use of L LE To self assist; physical assist for R LE management and to bring trunk to upright position Transfers Overall transfer level: Needs assistance Equipment used: Rolling walker (2 wheeled) Transfers: Sit to/from Stand Sit to Stand: +2 physical assistance;Mod assist General transfer comment: cues for transition position and use of UEs to self assist Ambulation/Gait Ambulation/Gait assistance: +2 safety/equipment;Mod assist Ambulation Distance (Feet): 70 Feet Assistive device: Rolling walker (2 wheeled) Gait Pattern/deviations: Step-to pattern;Decreased step length - right;Decreased step length - left;Shuffle;Antalgic;Trunk flexed Gait velocity: decr General Gait Details: cues for sequence, posture and  position from RW    Exercises Total Joint Exercises Ankle Circles/Pumps: AROM;15 reps;Both;Supine Quad Sets: AROM;Both;10 reps;Supine Heel Slides: AAROM;Supine;20 reps;Right Hip ABduction/ADduction: AAROM;Right;15 reps;Supine   PT Diagnosis: Difficulty walking  PT Problem List: Decreased strength;Decreased range of motion;Decreased activity tolerance;Decreased mobility;Decreased knowledge of use of DME;Pain PT Treatment Interventions: DME instruction;Gait training;Stair training;Functional mobility training;Therapeutic activities;Therapeutic exercise;Balance training;Patient/family education     PT Goals(Current goals can be found in the care plan section) Acute Rehab PT Goals Patient Stated Goal: Walk without pain PT Goal Formulation: With patient Time For Goal Achievement: 10/18/13 Potential to Achieve Goals: Good  Visit Information  Last PT Received On: 10/12/13 Assistance Needed: +2       Prior Functioning  Home Living Family/patient expects to be discharged to:: Private residence Living Arrangements: Children;Other (Comment) Available Help at Discharge: Family;Personal care attendant;Available 24 hours/day Type of Home: House Home Access: Stairs to enter [employer] of Steps: 9 Entrance Stairs-Rails: Right Home Layout: Able to live on main level with bedroom/bathroom Home Equipment: Walker - 2 wheels;Cane - single point Prior Function Level of Independence: Needs assistance Gait / Transfers Assistance Needed: RW Comments: Had personal care attendants prior to surgery Communication Communication: No difficulties Dominant Hand: Right    Cognition  Cognition Arousal/Alertness: Awake/alert Behavior During Therapy: WFL for tasks assessed/performed Overall Cognitive Status: Within Functional Limits for tasks assessed    Extremity/Trunk Assessment Upper Extremity Assessment Upper Extremity Assessment: Defer to OT evaluation Lower Extremity  Assessment Lower Extremity Assessment: RLE deficits/detail RLE Deficits / Details: Hip strength 2+/5 with AAROM at hip to 90 flex and 20 abd   Balance    End of Session PT - End of Session Equipment Utilized During Treatment: Gait belt Activity Tolerance: Patient  tolerated treatment well Patient left: in chair;with call bell/phone within reach;with family/visitor present Nurse Communication: Mobility status  GP     Jackelyne Sayer 10/12/2013, 12:35 PM

## 2013-10-12 NOTE — Care Management Note (Addendum)
    Page 1 of 2   10/14/2013     11:22:03 AM   CARE MANAGEMENT NOTE 10/14/2013  Patient:  Lisa Sherman, Lisa Sherman   Account Number:  [id number]  Date Initiated:  10/12/2013  Documentation initiated by:  Dessa Phi  Subjective/Objective Assessment:   73 Y/O F ADMITTED W/R HIP OA.     Action/Plan:   FROM HOME.[facility] FOLLOWING.   Anticipated DC Date:  10/14/2013   Anticipated DC Plan:  [place]  CM consult      [facility] Choice  HOME HEALTH   Choice offered to / List presented to:  C-1 Patient        [place] arranged  HH-2 PT      [place]   Status of service:  Completed, signed off Medicare Important Message given?  NA - LOS <3 / Initial given by admissions (If response is "NO", the following Medicare IM given date fields will be blank) Date Medicare IM given:   Date Additional Medicare IM given:    Discharge Disposition:  [place]  Per UR Regulation:    If discussed at Long Length of Stay Meetings, dates discussed:    Comments:  10/14/2013 Sherrin Daisy BSN RN [phone] CM spoke with patient who [plans to return to her home in [facility] where she has personal assistant who will be caregiver. She already has all of DME, including RW at home. Arville Go will HHPT/OT. Arville Go will notify patient of start date.   10/12/13 KATHY MAHABIR RN,BSN NCM [phone] POD#1 R THA.PT-HH.HHPT ORDERED.[facility] DONNA REP ALREADY FOLLOWING.JUST CALL WHEN D/C ORDER RECEIVED [id number].

## 2013-10-13 LAB — CBC
HEMATOCRIT: 30.1 % — AB (ref 36.0–46.0)
Hemoglobin: 9.6 g/dL — ABNORMAL LOW (ref 12.0–15.0)
MCH: 31.4 pg (ref 26.0–34.0)
MCHC: 31.9 g/dL (ref 30.0–36.0)
MCV: 98.4 fL (ref 78.0–100.0)
Platelets: 182 10*3/uL (ref 150–400)
RBC: 3.06 MIL/uL — ABNORMAL LOW (ref 3.87–5.11)
RDW: 16.5 % — AB (ref 11.5–15.5)
WBC: 10.4 10*3/uL (ref 4.0–10.5)

## 2013-10-13 NOTE — Progress Notes (Signed)
Physical Therapy Treatment Patient Details Name: Lisa Sherman MRN: [id number] DOB: 1941-02-16 Today's Date: 10/13/2013 Time: 1308-6578 PT Time Calculation (min): 28 min  PT Assessment / Plan / Recommendation  History of Present Illness Anterior THA- right   PT Comments   Reviewed stairs and car transfers with pt and caregiver.  Follow Up Recommendations  Home health PT     Does the patient have the potential to tolerate intense rehabilitation     Barriers to Discharge        Equipment Recommendations  None recommended by PT    Recommendations for Other Services OT consult  Frequency 7X/week   Progress towards PT Goals Progress towards PT goals: Progressing toward goals  Plan Current plan remains appropriate    Precautions / Restrictions Precautions Precautions: Fall;Other (comment) Precaution Comments: Ltd ROM bilat shoulders Restrictions Weight Bearing Restrictions: No Other Position/Activity Restrictions: WBAT   Pertinent Vitals/Pain Min c/o pain; ice packs provided    Mobility  Bed Mobility Overal bed mobility: Needs Assistance Bed Mobility: Supine to Sit;Sit to Supine Supine to sit: Min assist Sit to supine: Min assist General bed mobility comments: cues for sequence and use of L LE to self assist  Transfers Overall transfer level: Needs assistance Equipment used: Rolling walker (2 wheeled) Transfers: Sit to/from Stand Sit to Stand: Min guard Ambulation/Gait Ambulation/Gait assistance: Min guard;Supervision Ambulation Distance (Feet): 500 Feet Assistive device: Rolling walker (2 wheeled) Gait Pattern/deviations: Step-through pattern;Shuffle;Trunk flexed;Narrow base of support Gait velocity: decr General Gait Details: min cues for posture, position from RW and to increase BOS Stairs: Yes Stairs assistance: Min assist Stair Management: One rail Right;Step to pattern;Forwards;With cane Number of Stairs: 4    Exercises     PT Diagnosis:    PT Problem  List:   PT Treatment Interventions:     PT Goals (current goals can now be found in the care plan section) Acute Rehab PT Goals Patient Stated Goal: Walk without pain PT Goal Formulation: With patient Time For Goal Achievement: 10/18/13 Potential to Achieve Goals: Good  Visit Information  Last PT Received On: 10/13/13 Assistance Needed: +1 History of Present Illness: Anterior THA- right    Subjective Data  Subjective: Doing much better Patient Stated Goal: Walk without pain   Cognition  Cognition Arousal/Alertness: Awake/alert Behavior During Therapy: WFL for tasks assessed/performed Overall Cognitive Status: Within Functional Limits for tasks assessed    Balance     End of Session PT - End of Session Equipment Utilized During Treatment: Gait belt Activity Tolerance: Patient tolerated treatment well Patient left: in bed;with call bell/phone within reach;with family/visitor present Nurse Communication: Mobility status   GP     Lajada Janes 10/13/2013, 4:20 PM

## 2013-10-13 NOTE — Progress Notes (Signed)
Patient ID: Lisa Sherman, female   DOB: 1940/12/14, 73 y.o.   MRN: [id number] Postoperative day 2 total hip arthroplasty hemoglobin 9.6. Physical therapy progressive ambulation. F. L2 was signed.

## 2013-10-13 NOTE — Progress Notes (Signed)
Physical Therapy Treatment Patient Details Name: Lisa Sherman MRN: [id number] DOB: [birth year] Today's Date: 10/13/2013 Time: 6301-6010 PT Time Calculation (min): 41 min  PT Assessment / Plan / Recommendation  History of Present Illness Anterior THA- right   PT Comments     Follow Up Recommendations  Home health PT     Does the patient have the potential to tolerate intense rehabilitation     Barriers to Discharge        Equipment Recommendations  None recommended by PT    Recommendations for Other Services OT consult  Frequency 7X/week   Progress towards PT Goals Progress towards PT goals: Progressing toward goals  Plan Current plan remains appropriate    Precautions / Restrictions Precautions Precautions: Fall;Other (comment) Precaution Comments: Ltd ROM bilat shoulders Restrictions Weight Bearing Restrictions: No Other Position/Activity Restrictions: WBAT   Pertinent Vitals/Pain 3/10; ice pack provided    Mobility  Bed Mobility Overal bed mobility: Needs Assistance Bed Mobility: Sit to Supine Sit to supine: Min assist;HOB elevated General bed mobility comments: cues for sequence and use of L LE to assist R Transfers Overall transfer level: Needs assistance Equipment used: Rolling walker (2 wheeled) Transfers: Sit to/from Stand Sit to Stand: Min guard Stand pivot transfers: Min guard General transfer comment: cues for LE management and use of UEs to self assist Ambulation/Gait Ambulation/Gait assistance: Min guard Ambulation Distance (Feet): 300 Feet Assistive device: Rolling walker (2 wheeled) Gait Pattern/deviations: Step-to pattern;Step-through pattern;Decreased step length - right;Decreased step length - left;Narrow base of support;Antalgic;Shuffle;Trunk flexed Gait velocity: decr General Gait Details: cues for sequence, posture and position from RW    Exercises Total Joint Exercises Ankle Circles/Pumps: AROM;15 reps;Both;Supine Quad Sets: AROM;Both;10  reps;Supine Gluteal Sets: AROM;Both;10 reps;Supine Heel Slides: AAROM;Supine;20 reps;Right Hip ABduction/ADduction: AAROM;Right;Supine;20 reps   PT Diagnosis:    PT Problem List:   PT Treatment Interventions:     PT Goals (current goals can now be found in the care plan section) Acute Rehab PT Goals Patient Stated Goal: Walk without pain PT Goal Formulation: With patient Time For Goal Achievement: 10/18/13 Potential to Achieve Goals: Good  Visit Information  Last PT Received On: 10/13/13 Assistance Needed: +1 History of Present Illness: Anterior THA- right    Subjective Data  Subjective: Doing much better Patient Stated Goal: Walk without pain   Cognition  Cognition Arousal/Alertness: Awake/alert Behavior During Therapy: WFL for tasks assessed/performed Overall Cognitive Status: Within Functional Limits for tasks assessed    Balance     End of Session PT - End of Session Equipment Utilized During Treatment: Gait belt Activity Tolerance: Patient tolerated treatment well Patient left: in bed;with call bell/phone within reach;with family/visitor present Nurse Communication: Mobility status   GP     [place] Freyre 10/13/2013, 1:31 PM

## 2013-10-13 NOTE — Evaluation (Signed)
Occupational Therapy Evaluation Patient Details Name: Lisa Sherman MRN: [id number] DOB: Oct 16, 1940 Today's Date: 10/13/2013 Time: 3818-2993 OT Time Calculation (min): 59 min  OT Assessment / Plan / Recommendation History of present illness Anterior THA- right   Clinical Impression   Pt presents to OT with decreased I with ADL activity. Shoulders are very limiting to pt. Pt does have a caregiver- but will benefit from [facility] to address ADL activity in the home   OT Assessment  All further OT needs can be met in the next venue of care    Follow Up Recommendations  Home health OT       Equipment Recommendations  None recommended by OT          Precautions / Restrictions Precautions Precautions: Fall;Other (comment) Precaution Comments: Ltd ROM bilat shoulders Restrictions Weight Bearing Restrictions: No Other Position/Activity Restrictions: WBAT       ADL  Grooming: Set up Where Assessed - Grooming: Unsupported sitting Upper Body Bathing: Set up Where Assessed - Upper Body Bathing: Unsupported sitting Lower Body Bathing: Moderate assistance Where Assessed - Lower Body Bathing: Supported sit to stand Upper Body Dressing: Set up Where Assessed - Upper Body Dressing: Unsupported sitting Lower Body Dressing: Moderate assistance Where Assessed - Lower Body Dressing: Supported sit to Lobbyist: Minimal assistance Toilet Transfer Method: Sit to stand;Stand pivot Science writer: Comfort height toilet Toileting - Clothing Manipulation and Hygiene: Supervision/safety Where Assessed - Toileting Clothing Manipulation and Hygiene: Standing ADL Comments: Pt educated in use of AE in which caregiver will obtain for her.  Pts caregiver will provide 24.7 A for pt with ADL activity Shoulders are very limiting to pts I with ADL activity     OT Diagnosis: Generalized weakness  OT Problem List: Decreased strength;Decreased activity tolerance   OT Goals(Current goals  can be found in the care plan section) Acute Rehab OT Goals Patient Stated Goal: Walk without pain  Visit Information  Last OT Received On: 10/13/13 Assistance Needed: +1 History of Present Illness: Anterior THA- right       Prior Functioning     Home Living Family/patient expects to be discharged to:: Private residence Living Arrangements: Children;Other (Comment) Available Help at Discharge: Family;Personal care attendant;Available 24 hours/day Type of Home: House Home Access: Stairs to enter Entrance Stairs-Rails: Right Home Layout: Able to live on main level with bedroom/bathroom Home Equipment: Toilet riser Communication Communication: No difficulties         Vision/Perception Vision - History Patient Visual Report: No change from baseline   Cognition  Cognition Arousal/Alertness: Awake/alert Behavior During Therapy: WFL for tasks assessed/performed Overall Cognitive Status: Within Functional Limits for tasks assessed    Extremity/Trunk Assessment Upper Extremity Assessment Upper Extremity Assessment: RUE deficits/detail;LUE deficits/detail RUE Deficits / Details: limited shoulder flexion LUE Deficits / Details: limited shoulder flexion     Mobility Bed Mobility Overal bed mobility: Needs Assistance Bed Mobility: Sit to Supine Sit to supine: Min assist;HOB elevated Transfers Overall transfer level: Needs assistance Equipment used: Rolling walker (2 wheeled) Transfers: Sit to/from [employer] Sit to Stand: Min guard Stand pivot transfers: Min guard           End of Session OT - End of Session Equipment Utilized During Treatment: Rolling walker Activity Tolerance: Patient tolerated treatment well Patient left: in chair;with call bell/phone within reach;with family/visitor present Nurse Communication: Mobility status  GO     Betsy Pries 10/13/2013, 9:59 AM

## 2013-10-14 ENCOUNTER — Encounter (HOSPITAL_COMMUNITY): Payer: Self-pay | Admitting: Orthopaedic Surgery

## 2013-10-14 LAB — CBC
HCT: 28.5 % — ABNORMAL LOW (ref 36.0–46.0)
HEMOGLOBIN: 9.2 g/dL — AB (ref 12.0–15.0)
MCH: 31.4 pg (ref 26.0–34.0)
MCHC: 32.3 g/dL (ref 30.0–36.0)
MCV: 97.3 fL (ref 78.0–100.0)
Platelets: 200 10*3/uL (ref 150–400)
RBC: 2.93 MIL/uL — AB (ref 3.87–5.11)
RDW: 16.4 % — ABNORMAL HIGH (ref 11.5–15.5)
WBC: 9.8 10*3/uL (ref 4.0–10.5)

## 2013-10-14 MED ORDER — ASPIRIN 325 MG PO TBEC: 325.0000 mg | DELAYED_RELEASE_TABLET | Freq: Two times a day (BID) | ORAL | Status: DC

## 2013-10-14 MED ORDER — OXYCODONE HCL ER 20 MG PO T12A: 20.0000 mg | EXTENDED_RELEASE_TABLET | Freq: Two times a day (BID) | ORAL | Status: DC

## 2013-10-14 MED ORDER — METHOCARBAMOL 500 MG PO TABS: 500.0000 mg | ORAL_TABLET | Freq: Four times a day (QID) | ORAL | Status: DC | PRN

## 2013-10-14 NOTE — Progress Notes (Signed)
Patient ID: Lisa Sherman, female   DOB: September 07, 1941, 73 y.o.   MRN: [id number] Looks good.  Therapy has gone well.  Can discharge home today.

## 2013-10-14 NOTE — Progress Notes (Signed)
Discharge instructions were reviewed with patient and her caregiver.  Questions about medications were answered by nurse.  Pt verbalized understanding of discharge instructions. Home health PT, OT setup for patients discharge. No concerns at time of discharge.

## 2013-10-14 NOTE — Anesthesia Postprocedure Evaluation (Signed)
Anesthesia Post Note  Patient: Lisa Sherman  Procedure(s) Performed: Procedure(s) (LRB): RIGHT TOTAL HIP ARTHROPLASTY ANTERIOR APPROACH (Right)  Anesthesia type: Spinal  Patient location: PACU  Post pain: Pain level controlled  Post assessment: Post-op Vital signs reviewed  Last Vitals: BP 124/73  Pulse 61  Temp(Src) 36.9 C (Oral)  Resp 16  Ht 5\' 6"  (1.676 m)  Wt 145 lb (65.772 kg)  BMI 23.41 kg/m2  SpO2 95%  Post vital signs: Reviewed  Level of consciousness: sedated  Complications: No apparent anesthesia complications

## 2013-10-14 NOTE — Progress Notes (Signed)
Physical Therapy Treatment Patient Details Name: Lisa Sherman MRN: [id number] DOB: [birth year] Today's Date: 10/14/2013 Time: 4431-5400 PT Time Calculation (min): 44 min  PT Assessment / Plan / Recommendation  History of Present Illness Anterior THA- right   PT Comments   Pt and caregiver educated on stairs and car transfers.  Follow Up Recommendations  Home health PT     Does the patient have the potential to tolerate intense rehabilitation     Barriers to Discharge        Equipment Recommendations  None recommended by PT    Recommendations for Other Services OT consult  Frequency 7X/week   Progress towards PT Goals Progress towards PT goals: Progressing toward goals  Plan Current plan remains appropriate    Precautions / Restrictions Precautions Precautions: Fall;Other (comment) Precaution Comments: Ltd ROM bilat shoulders Restrictions Weight Bearing Restrictions: No Other Position/Activity Restrictions: WBAT   Pertinent Vitals/Pain Min c/o pain    Mobility  Bed Mobility Overal bed mobility: Needs Assistance Bed Mobility: Supine to Sit;Sit to Supine Supine to sit: Min guard Sit to supine: Min assist General bed mobility comments: min cues for sequencingq Transfers Overall transfer level: Modified independent Equipment used: Rolling walker (2 wheeled) Transfers: Sit to/from Stand Sit to Stand: Supervision Stand pivot transfers: Supervision Ambulation/Gait Ambulation/Gait assistance: Supervision Ambulation Distance (Feet): 500 Feet Assistive device: Rolling walker (2 wheeled) Gait Pattern/deviations: Step-through pattern;Trunk flexed Gait velocity: decr General Gait Details: min cues for posture, position from RW and to increase BOS Stairs assistance: Min assist Stair Management: One rail Right;Forwards;With cane;Step to pattern Number of Stairs: 4    Exercises Total Joint Exercises Ankle Circles/Pumps: AROM;15 reps;Both;Supine Quad Sets: AROM;Both;10  reps;Supine Gluteal Sets: AROM;Both;10 reps;Supine Heel Slides: AAROM;Supine;20 reps;Right Hip ABduction/ADduction: AAROM;Right;Supine;20 reps Long Arc Quad: AROM;Right;10 reps;Supine   PT Diagnosis:    PT Problem List:   PT Treatment Interventions:     PT Goals (current goals can now be found in the care plan section) Acute Rehab PT Goals Patient Stated Goal: Walk without pain PT Goal Formulation: With patient Time For Goal Achievement: 10/18/13 Potential to Achieve Goals: Good  Visit Information  Last PT Received On: 10/14/13 Assistance Needed: +1 History of Present Illness: Anterior THA- right    Subjective Data  Subjective: Doing much better Patient Stated Goal: Walk without pain   Cognition  Cognition Arousal/Alertness: Awake/alert Behavior During Therapy: WFL for tasks assessed/performed Overall Cognitive Status: Within Functional Limits for tasks assessed    Balance     End of Session PT - End of Session Equipment Utilized During Treatment: Gait belt Activity Tolerance: Patient tolerated treatment well Patient left: in bed;with call bell/phone within reach;with family/visitor present Nurse Communication: Mobility status   GP     Lisa Sherman 10/14/2013, 12:36 PM

## 2013-10-14 NOTE — Discharge Summary (Signed)
Patient ID: Lisa Sherman MRN: [id number] DOB/AGE: 75-29-1942 75 y.o.  Admit date: 10/11/2013 Discharge date: 10/14/2013  Admission Diagnoses:  Principal Problem:   Osteonecrosis of right hip Active Problems:   Status post THR (total hip replacement)   Discharge Diagnoses:  Same  Past Medical History  Diagnosis Date  . Cancer     hx of lung cancer - surgery - chemo - radiation- all completed july 2009  . Pneumonia     yrs ago  . GERD (gastroesophageal reflux disease)   . Arthritis     severe oa right hip; also has arthritis both shoulders-limited rom.    Surgeries: Procedure(s): RIGHT TOTAL HIP ARTHROPLASTY ANTERIOR APPROACH on 10/11/2013   Consultants:    Discharged Condition: Improved  Hospital Course: Lisa Sherman is an 73 y.o. female who was admitted 10/11/2013 for operative treatment ofOsteonecrosis of hip. Patient has severe unremitting pain that affects sleep, daily activities, and work/hobbies. After pre-op clearance the patient was taken to the operating room on 10/11/2013 and underwent  Procedure(s): RIGHT TOTAL HIP ARTHROPLASTY ANTERIOR APPROACH.    Patient was given perioperative antibiotics: Anti-infectives   Start     Dose/Rate Route Frequency Ordered Stop   10/11/13 2000  levofloxacin (LEVAQUIN) tablet 500 mg    Comments:  On this medication/antibiotic prior to surgery due to WBC in urine (? UTI)   500 mg Oral Daily 10/11/13 1737     10/11/13 2000  ceFAZolin (ANCEF) IVPB 1 g/50 mL premix     1 g 100 mL/hr over 30 Minutes Intravenous Every 6 hours 10/11/13 1737 10/12/13 0153   10/11/13 0951  ceFAZolin (ANCEF) IVPB 2 g/50 mL premix     2 g 100 mL/hr over 30 Minutes Intravenous On call to O.R. 10/11/13 0951 10/11/13 1343       Patient was given sequential compression devices, early ambulation, and chemoprophylaxis to prevent DVT.  Patient benefited maximally from hospital stay and there were no complications.    Recent vital signs: Patient Vitals for the  past 24 hrs:  BP Temp Temp src Pulse Resp SpO2  10/14/13 0530 124/73 mmHg 98.5 F (36.9 C) Oral 61 16 95 %  10/13/13 2145 104/73 mmHg 98.7 F (37.1 C) Oral 83 16 98 %  10/13/13 1600 - - - - 16 -  10/13/13 1539 97/64 mmHg 98 F (36.7 C) Oral 68 16 95 %  10/13/13 1200 - - - - 18 -  10/13/13 0800 - - - - 16 94 %     Recent laboratory studies:  Recent Labs  10/11/13 1025 10/12/13 0512 10/13/13 0508 10/14/13 0524  WBC 8.5 9.9 10.4 9.8  HGB 12.0 10.0* 9.6* 9.2*  HCT 36.3 31.3* 30.1* 28.5*  PLT 250 201 182 200  NA 139 138  --   --   K 3.8 3.7  --   --   CL 100 101  --   --   CO2 28 27  --   --   BUN 13 11  --   --   CREATININE 0.75 0.70  --   --   GLUCOSE 92 113*  --   --   INR 1.12  --   --   --   CALCIUM 9.5 8.3*  --   --      Discharge Medications:     Medication List    STOP taking these medications       celecoxib 100 MG capsule  Commonly known as:  CELEBREX  TAKE these medications       aspirin 325 MG EC tablet  Take 1 tablet (325 mg total) by mouth 2 (two) times daily after a meal.     cabergoline 0.5 MG tablet  Commonly known as:  DOSTINEX  Take 0.5 mg by mouth 2 (two) times a week. Monday, and fridays     HYDROmorphone 2 MG tablet  Commonly known as:  DILAUDID  Take 2 mg by mouth every 4 (four) hours as needed for moderate pain or severe pain.     levofloxacin 500 MG tablet  Commonly known as:  LEVAQUIN  Take 500 mg by mouth daily.     methocarbamol 500 MG tablet  Commonly known as:  ROBAXIN  Take 1 tablet (500 mg total) by mouth every 6 (six) hours as needed for muscle spasms.     NIACIN PO  Take 1 tablet by mouth daily.     OVER THE COUNTER MEDICATION  Take 1 tablet by mouth 2 (two) times daily.     OVER THE COUNTER MEDICATION  Take 1 tablet by mouth daily.     OVER THE COUNTER MEDICATION  Take 1 tablet by mouth 2 (two) times daily.     OVER THE COUNTER MEDICATION  Take 1 tablet by mouth daily.     OVER THE COUNTER MEDICATION   Take 1 tablet by mouth daily.     OVER THE COUNTER MEDICATION  Take 1 tablet by mouth daily.     OVER THE COUNTER MEDICATION  Take 1 tablet by mouth 2 (two) times daily.     OVER THE COUNTER MEDICATION  Take 1 tablet by mouth 2 (two) times daily.     OVER THE COUNTER MEDICATION  Take 1 tablet by mouth daily.     OxyCODONE 20 mg T12a 12 hr tablet  Commonly known as:  OXYCONTIN  Take 1 tablet (20 mg total) by mouth every 12 (twelve) hours.     VITAMIN D BOOSTER PO  Take 5 drops by mouth daily.     ZINC PO  Take 1 tablet by mouth 2 (two) times daily.        Diagnostic Studies: Dg Pelvis Portable  10/11/2013   CLINICAL DATA:  Post right hip replacement  EXAM: PORTABLE PELVIS 1-2 VIEWS  COMPARISON:  Portable exam 1613 hr compared to 08/18/2013  FINDINGS: Acetabular and femoral components of right hip prosthesis are newly identified.  No acute fracture or dislocation.  Diffuse osseous demineralization.  Pelvis appears intact.  Left hip joint space preserved.  Symmetric SI joints.  Expected postsurgical soft tissues changes laterally at the left hip region.  IMPRESSION: Right hip prosthesis without acute complication.   Electronically Signed   By: Lavonia Dana M.D.   On: 10/11/2013 16:36   Dg Hip Portable 1 View Right  10/11/2013   CLINICAL DATA:  Postop right hip arthroplasty  EXAM: PORTABLE RIGHT HIP - 1 VIEW  COMPARISON:  08/18/2013  FINDINGS: Single lateral view of the right hip shows a right hip arthroplasty that is well seated and aligned. No acute fracture or evidence of an operative complication.  IMPRESSION: Well-positioned right hip arthroplasty   Electronically Signed   By: Lajean Manes M.D.   On: 10/11/2013 16:36   Dg C-arm 1-60 Min-no Report  10/11/2013   CLINICAL DATA: hip   C-ARM 1-60 MINUTES  Fluoroscopy was utilized by the requesting physician.  No radiographic  interpretation.     Disposition:  To home  Discharge Orders   Future Orders Complete By Expires    Call MD / Call 911  As directed    Comments:     If you experience chest pain or shortness of breath, CALL 911 and be transported to the hospital emergency room.  If you develope a fever above 101 F, pus (white drainage) or increased drainage or redness at the wound, or calf pain, call your surgeon's office.   Constipation Prevention  As directed    Comments:     Drink plenty of fluids.  Prune juice may be helpful.  You may use a stool softener, such as Colace (over the counter) 100 mg twice a day.  Use MiraLax (over the counter) for constipation as needed.   Diet - low sodium heart healthy  As directed    Discharge instructions  As directed    Comments:     Increase your activities as comfort allows. Expect right thigh and leg swelling.  Ice and elevation as needed. You can get your actual dressing wet in the shower. You can remove your dressing this Friday 1/16 and get your actual incision wet. Leave steri-strips in place. Do get an over-the-counter stool softener.   Discharge patient  As directed    Increase activity slowly as tolerated  As directed       Follow-up Information   Follow up with Mcarthur Rossetti, MD. Schedule an appointment as soon as possible for a visit on 10/14/2013.   Specialty:  Orthopedic Surgery   Contact information:   [place] [address] [phone]        Signed: Mcarthur Rossetti 10/14/2013, 7:45 AM

## 2013-10-15 NOTE — Progress Notes (Signed)
Utilization review completed.  

## 2013-10-28 ENCOUNTER — Other Ambulatory Visit: Payer: Self-pay | Admitting: Orthopedic Surgery

## 2013-11-19 ENCOUNTER — Encounter (HOSPITAL_COMMUNITY): Payer: Self-pay | Admitting: Pharmacy Technician

## 2013-11-20 ENCOUNTER — Encounter (HOSPITAL_COMMUNITY)
Admission: RE | Admit: 2013-11-20 | Discharge: 2013-11-20 | Disposition: A | Discharge status: Home / Self Care | Payer: Medicare Other | Source: Ambulatory Visit | Attending: Orthopedic Surgery | Admitting: Orthopedic Surgery

## 2013-11-20 ENCOUNTER — Other Ambulatory Visit (HOSPITAL_COMMUNITY): Payer: Medicare Other

## 2013-11-20 ENCOUNTER — Encounter (HOSPITAL_COMMUNITY): Payer: Self-pay

## 2013-11-20 DIAGNOSIS — Z01810 Encounter for preprocedural cardiovascular examination: Secondary | ICD-10-CM | POA: Insufficient documentation

## 2013-11-20 DIAGNOSIS — Z01812 Encounter for preprocedural laboratory examination: Secondary | ICD-10-CM | POA: Insufficient documentation

## 2013-11-20 DIAGNOSIS — Z01818 Encounter for other preprocedural examination: Secondary | ICD-10-CM | POA: Insufficient documentation

## 2013-11-20 HISTORY — DX: Adverse effect of unspecified anesthetic, initial encounter: T41.45XA

## 2013-11-20 HISTORY — DX: Other complications of anesthesia, initial encounter: T88.59XA

## 2013-11-20 LAB — CBC WITH DIFFERENTIAL/PLATELET
Basophils Absolute: 0.1 10*3/uL (ref 0.0–0.1)
Basophils Relative: 2 % — ABNORMAL HIGH (ref 0–1)
EOS ABS: 0.4 10*3/uL (ref 0.0–0.7)
Eosinophils Relative: 5 % (ref 0–5)
HEMATOCRIT: 38.9 % (ref 36.0–46.0)
HEMOGLOBIN: 13 g/dL (ref 12.0–15.0)
Lymphocytes Relative: 18 % (ref 12–46)
Lymphs Abs: 1.4 10*3/uL (ref 0.7–4.0)
MCH: 33.1 pg (ref 26.0–34.0)
MCHC: 33.4 g/dL (ref 30.0–36.0)
MCV: 99 fL (ref 78.0–100.0)
MONO ABS: 0.6 10*3/uL (ref 0.1–1.0)
MONOS PCT: 8 % (ref 3–12)
NEUTROS ABS: 5.2 10*3/uL (ref 1.7–7.7)
NEUTROS PCT: 67 % (ref 43–77)
Platelets: 298 10*3/uL (ref 150–400)
RBC: 3.93 MIL/uL (ref 3.87–5.11)
RDW: 14.9 % (ref 11.5–15.5)
WBC: 7.8 10*3/uL (ref 4.0–10.5)

## 2013-11-20 LAB — APTT: aPTT: 30 seconds (ref 24–37)

## 2013-11-20 LAB — TYPE AND SCREEN
ABO/RH(D): A POS
ANTIBODY SCREEN: NEGATIVE

## 2013-11-20 LAB — COMPREHENSIVE METABOLIC PANEL
ALT: 13 U/L (ref 0–35)
AST: 17 U/L (ref 0–37)
Albumin: 3.6 g/dL (ref 3.5–5.2)
Alkaline Phosphatase: 134 U/L — ABNORMAL HIGH (ref 39–117)
BUN: 14 mg/dL (ref 6–23)
CALCIUM: 9.6 mg/dL (ref 8.4–10.5)
CO2: 26 mEq/L (ref 19–32)
Chloride: 101 mEq/L (ref 96–112)
Creatinine, Ser: 0.76 mg/dL (ref 0.50–1.10)
GFR calc non Af Amer: 81 mL/min — ABNORMAL LOW (ref >90)
GLUCOSE: 83 mg/dL (ref 70–99)
Potassium: 4.2 mEq/L (ref 3.7–5.3)
Sodium: 138 mEq/L (ref 137–147)
Total Bilirubin: 0.3 mg/dL (ref 0.3–1.2)
Total Protein: 6.9 g/dL (ref 6.0–8.3)

## 2013-11-20 LAB — URINALYSIS, ROUTINE W REFLEX MICROSCOPIC
BILIRUBIN URINE: NEGATIVE
Glucose, UA: NEGATIVE mg/dL
Hgb urine dipstick: NEGATIVE
Ketones, ur: NEGATIVE mg/dL
Leukocytes, UA: NEGATIVE
Nitrite: NEGATIVE
PROTEIN: NEGATIVE mg/dL
Specific Gravity, Urine: 1.02 (ref 1.005–1.030)
UROBILINOGEN UA: 0.2 mg/dL (ref 0.0–1.0)
pH: 6.5 (ref 5.0–8.0)

## 2013-11-20 LAB — SURGICAL PCR SCREEN
MRSA, PCR: NEGATIVE
Staphylococcus aureus: NEGATIVE

## 2013-11-20 LAB — ABO/RH: ABO/RH(D): A POS

## 2013-11-20 LAB — PROTIME-INR
INR: 1.07 (ref 0.00–1.49)
PROTHROMBIN TIME: 13.7 s (ref 11.6–15.2)

## 2013-11-20 MED ORDER — CHLORHEXIDINE GLUCONATE 4 % EX LIQD: 60.0000 mL | Freq: Once | CUTANEOUS | Status: DC

## 2013-11-20 NOTE — Pre-Procedure Instructions (Signed)
Lisa Sherman  11/20/2013   Your procedure is scheduled on:  Thursday February 26 th at 65 AM  Report to [facility] Short Stay Main Entrance "A" at 0830 AM.  Call this number if you have problems the morning of surgery: [phone]   Remember:   Do not eat food or drink liquids after midnight Wednesday   Take these medicines the morning of surgery with A SIP OF WATER: Hydromorphone if needed for pain  Stop taking Vitamins, Herbal medication, Aspirin, and Nsaids (Ibprofen, Advil, Naproxen, Aleve) as of 11/21/13  Do not wear jewelry, make-up or nail polish.  Do not wear lotions, powders, or perfumes. You may wear deodorant.  Do not shave 48 hours prior to surgery.  Do not bring valuables to the hospital.  [facility] is not responsible  for any belongings or valuables.               Contacts, dentures or bridgework may not be worn into surgery.  Leave suitcase in the car. After surgery it may be brought to your room.  For patients admitted to the hospital, discharge time is determined by your  treatment team.               Patients discharged the day of surgery will not be allowed to drive home.    Special Instructions: [place] - Preparing for Surgery  Before surgery, you can play an important role.  Because skin is not sterile, your skin needs to be as free of germs as possible.  You can reduce the number of germs on you skin by washing with CHG (chlorahexidine gluconate) soap before surgery.  CHG is an antiseptic cleaner which kills germs and bonds with the skin to continue killing germs even after washing.  Please DO NOT use if you have an allergy to CHG or antibacterial soaps.  If your skin becomes reddened/irritated stop using the CHG and inform your nurse when you arrive at Short Stay.  Do not shave (including legs and underarms) for at least 48 hours prior to the first CHG shower.  You may shave your face.  Please follow these instructions carefully:   1.  Shower with CHG  Soap the night before surgery and the morning of Surgery.  2.  If you choose to wash your hair, wash your hair first as usual with your normal shampoo.  3.  After you shampoo, rinse your hair and body thoroughly to remove the Shampoo.  4.  Use CHG as you would any other liquid soap.  You can apply chg directly  to the skin and wash gently with scrungie or a clean washcloth.  5.  Apply the CHG Soap to your body ONLY FROM THE NECK DOWN.  Do not use on open wounds or open sores.  Avoid contact with your eyes,   ears, mouth and genitals (private parts).  Wash genitals (private parts) with your normal soap.  6.  Wash thoroughly, paying special attention to the area where your surgery will be performed.  7.  Thoroughly rinse your body with warm water from the neck down.  8.  DO NOT shower/wash with your normal soap after using and rinsing off the CHG Soap.  9.  Pat yourself dry with a clean towel.            10.  Wear clean pajamas.            11.  Place clean sheets on your bed the night  of your first shower and do not  sleep with pets.  Day of Surgery  Do not apply any lotions/deoderants the morning of surgery.  Please wear clean clothes to the hospital/surgery center.      Please read over the following fact sheets that you were given: Pain Booklet, Coughing and Deep Breathing, Blood Transfusion Information, MRSA Information and Surgical Site Infection Prevention

## 2013-11-27 MED ORDER — CEFAZOLIN SODIUM-DEXTROSE 2-3 GM-% IV SOLR
2.0000 g | INTRAVENOUS | Status: AC
  Administered 2013-11-28: 2 g via INTRAVENOUS
  Filled 2013-11-27: qty 50

## 2013-11-28 ENCOUNTER — Encounter (HOSPITAL_COMMUNITY)
Admission: RE | Disposition: A | Discharge status: Home / Self Care | Payer: Self-pay | Source: Ambulatory Visit | Attending: Orthopedic Surgery

## 2013-11-28 ENCOUNTER — Inpatient Hospital Stay (HOSPITAL_COMMUNITY): Payer: Medicare Other

## 2013-11-28 ENCOUNTER — Encounter (HOSPITAL_COMMUNITY): Payer: Self-pay | Admitting: *Deleted

## 2013-11-28 ENCOUNTER — Inpatient Hospital Stay (HOSPITAL_COMMUNITY)
Admission: RE | Admit: 2013-11-28 | Discharge: 2013-11-29 | DRG: 483 | Disposition: A | Discharge status: Home / Self Care | Payer: Medicare Other | Source: Ambulatory Visit | Attending: Orthopedic Surgery | Admitting: Orthopedic Surgery

## 2013-11-28 ENCOUNTER — Encounter (HOSPITAL_COMMUNITY): Payer: Medicare Other | Admitting: Anesthesiology

## 2013-11-28 ENCOUNTER — Inpatient Hospital Stay (HOSPITAL_COMMUNITY): Payer: Medicare Other | Admitting: Anesthesiology

## 2013-11-28 DIAGNOSIS — K219 Gastro-esophageal reflux disease without esophagitis: Secondary | ICD-10-CM | POA: Diagnosis present

## 2013-11-28 DIAGNOSIS — Z85118 Personal history of other malignant neoplasm of bronchus and lung: Secondary | ICD-10-CM

## 2013-11-28 DIAGNOSIS — M19011 Primary osteoarthritis, right shoulder: Secondary | ICD-10-CM | POA: Diagnosis present

## 2013-11-28 DIAGNOSIS — Z87891 Personal history of nicotine dependence: Secondary | ICD-10-CM

## 2013-11-28 DIAGNOSIS — M19019 Primary osteoarthritis, unspecified shoulder: Principal | ICD-10-CM | POA: Diagnosis present

## 2013-11-28 DIAGNOSIS — Z860100 Personal history of colon polyps, unspecified: Secondary | ICD-10-CM

## 2013-11-28 DIAGNOSIS — Z96649 Presence of unspecified artificial hip joint: Secondary | ICD-10-CM

## 2013-11-28 HISTORY — PX: TOTAL SHOULDER ARTHROPLASTY: SHX126

## 2013-11-28 SURGERY — ARTHROPLASTY, SHOULDER, TOTAL
Anesthesia: General | Site: Shoulder | Laterality: Right

## 2013-11-28 MED ORDER — FENTANYL CITRATE 0.05 MG/ML IJ SOLN
INTRAMUSCULAR | Status: DC | PRN
  Administered 2013-11-28 (×2): 50 ug via INTRAVENOUS

## 2013-11-28 MED ORDER — EPHEDRINE SULFATE 50 MG/ML IJ SOLN
INTRAMUSCULAR | Status: DC | PRN
  Administered 2013-11-28: 15 mg via INTRAVENOUS

## 2013-11-28 MED ORDER — MIDAZOLAM HCL 2 MG/2ML IJ SOLN
INTRAMUSCULAR | Status: AC
  Administered 2013-11-28: 2 mg via INTRAVENOUS
  Filled 2013-11-28: qty 2

## 2013-11-28 MED ORDER — ACETAMINOPHEN 650 MG RE SUPP: 650.0000 mg | Freq: Four times a day (QID) | RECTAL | Status: DC | PRN

## 2013-11-28 MED ORDER — HYDROMORPHONE HCL PF 1 MG/ML IJ SOLN: 0.2500 mg | INTRAMUSCULAR | Status: DC | PRN

## 2013-11-28 MED ORDER — MEPERIDINE HCL 25 MG/ML IJ SOLN: 6.2500 mg | INTRAMUSCULAR | Status: DC | PRN

## 2013-11-28 MED ORDER — HYDROMORPHONE HCL PF 1 MG/ML IJ SOLN
1.0000 mg | INTRAMUSCULAR | Status: DC | PRN
  Administered 2013-11-29 (×2): 1 mg via INTRAVENOUS
  Filled 2013-11-28 (×2): qty 1

## 2013-11-28 MED ORDER — DIPHENHYDRAMINE HCL 12.5 MG/5ML PO ELIX: 12.5000 mg | ORAL_SOLUTION | ORAL | Status: DC | PRN

## 2013-11-28 MED ORDER — CHLORHEXIDINE GLUCONATE 4 % EX LIQD
1.0000 "application " | Freq: Once | CUTANEOUS | Status: DC
  Filled 2013-11-28: qty 15

## 2013-11-28 MED ORDER — PHENYLEPHRINE 40 MCG/ML (10ML) SYRINGE FOR IV PUSH (FOR BLOOD PRESSURE SUPPORT)
PREFILLED_SYRINGE | INTRAVENOUS | Status: AC
  Filled 2013-11-28: qty 10

## 2013-11-28 MED ORDER — ACETAMINOPHEN 325 MG PO TABS: 650.0000 mg | ORAL_TABLET | Freq: Four times a day (QID) | ORAL | Status: DC | PRN

## 2013-11-28 MED ORDER — ONDANSETRON HCL 4 MG/2ML IJ SOLN: 4.0000 mg | Freq: Once | INTRAMUSCULAR | Status: DC | PRN

## 2013-11-28 MED ORDER — METOCLOPRAMIDE HCL 5 MG/ML IJ SOLN: 5.0000 mg | Freq: Three times a day (TID) | INTRAMUSCULAR | Status: DC | PRN

## 2013-11-28 MED ORDER — ONDANSETRON HCL 4 MG/2ML IJ SOLN: 4.0000 mg | Freq: Four times a day (QID) | INTRAMUSCULAR | Status: DC | PRN

## 2013-11-28 MED ORDER — CABERGOLINE 0.5 MG PO TABS: 0.5000 mg | ORAL_TABLET | ORAL | Status: DC

## 2013-11-28 MED ORDER — SODIUM CHLORIDE 0.9 % IV SOLN
INTRAVENOUS | Status: DC
  Administered 2013-11-28 – 2013-11-29 (×2): via INTRAVENOUS

## 2013-11-28 MED ORDER — MICROFIBRILLAR COLL HEMOSTAT EX PADS
MEDICATED_PAD | CUTANEOUS | Status: DC | PRN
  Administered 2013-11-28: 1 via TOPICAL

## 2013-11-28 MED ORDER — CHLORHEXIDINE GLUCONATE 4 % EX LIQD
60.0000 mL | Freq: Once | CUTANEOUS | Status: DC
  Filled 2013-11-28: qty 60

## 2013-11-28 MED ORDER — PHENOL 1.4 % MT LIQD: 1.0000 | OROMUCOSAL | Status: DC | PRN

## 2013-11-28 MED ORDER — OXYCODONE HCL 5 MG PO TABS: 5.0000 mg | ORAL_TABLET | Freq: Once | ORAL | Status: DC | PRN

## 2013-11-28 MED ORDER — ONDANSETRON HCL 4 MG/2ML IJ SOLN
INTRAMUSCULAR | Status: AC
  Filled 2013-11-28: qty 2

## 2013-11-28 MED ORDER — ALUMINUM HYDROXIDE GEL 320 MG/5ML PO SUSP
15.0000 mL | ORAL | Status: DC | PRN
  Filled 2013-11-28: qty 30

## 2013-11-28 MED ORDER — PHENYLEPHRINE HCL 10 MG/ML IJ SOLN
10.0000 mg | INTRAVENOUS | Status: DC | PRN
  Administered 2013-11-28: 20 ug/min via INTRAVENOUS

## 2013-11-28 MED ORDER — FENTANYL CITRATE 0.05 MG/ML IJ SOLN
INTRAMUSCULAR | Status: AC
  Administered 2013-11-28: 50 ug via INTRAVENOUS
  Filled 2013-11-28: qty 2

## 2013-11-28 MED ORDER — CEFAZOLIN SODIUM 1-5 GM-% IV SOLN
1.0000 g | Freq: Four times a day (QID) | INTRAVENOUS | Status: AC
  Administered 2013-11-28 – 2013-11-29 (×3): 1 g via INTRAVENOUS
  Filled 2013-11-28 (×3): qty 50

## 2013-11-28 MED ORDER — ARTIFICIAL TEARS OP OINT
TOPICAL_OINTMENT | OPHTHALMIC | Status: AC
  Filled 2013-11-28: qty 3.5

## 2013-11-28 MED ORDER — MENTHOL 3 MG MT LOZG: 1.0000 | LOZENGE | OROMUCOSAL | Status: DC | PRN

## 2013-11-28 MED ORDER — SODIUM CHLORIDE 0.9 % IR SOLN
Status: DC | PRN
  Administered 2013-11-28: 3000 mL

## 2013-11-28 MED ORDER — LIDOCAINE HCL (CARDIAC) 20 MG/ML IV SOLN
INTRAVENOUS | Status: DC | PRN
  Administered 2013-11-28: 100 mg via INTRAVENOUS

## 2013-11-28 MED ORDER — MIDAZOLAM HCL 2 MG/2ML IJ SOLN
INTRAMUSCULAR | Status: AC
  Filled 2013-11-28: qty 2

## 2013-11-28 MED ORDER — LACTATED RINGERS IV SOLN
INTRAVENOUS | Status: DC | PRN
  Administered 2013-11-28 (×2): via INTRAVENOUS

## 2013-11-28 MED ORDER — ONDANSETRON HCL 4 MG PO TABS: 4.0000 mg | ORAL_TABLET | Freq: Four times a day (QID) | ORAL | Status: DC | PRN

## 2013-11-28 MED ORDER — PROPOFOL 10 MG/ML IV BOLUS
INTRAVENOUS | Status: DC | PRN
  Administered 2013-11-28: 200 mg via INTRAVENOUS

## 2013-11-28 MED ORDER — ASPIRIN EC 325 MG PO TBEC
325.0000 mg | DELAYED_RELEASE_TABLET | Freq: Two times a day (BID) | ORAL | Status: DC
  Administered 2013-11-28 – 2013-11-29 (×2): 325 mg via ORAL
  Filled 2013-11-28 (×4): qty 1

## 2013-11-28 MED ORDER — PHENYLEPHRINE HCL 10 MG/ML IJ SOLN
INTRAMUSCULAR | Status: DC | PRN
  Administered 2013-11-28: 120 ug via INTRAVENOUS

## 2013-11-28 MED ORDER — OXYCODONE HCL 5 MG/5ML PO SOLN: 5.0000 mg | Freq: Once | ORAL | Status: DC | PRN

## 2013-11-28 MED ORDER — HYDROCODONE-ACETAMINOPHEN 5-325 MG PO TABS
1.0000 | ORAL_TABLET | ORAL | Status: DC | PRN
  Administered 2013-11-29: 2 via ORAL
  Filled 2013-11-28: qty 2

## 2013-11-28 MED ORDER — PROPOFOL 10 MG/ML IV BOLUS
INTRAVENOUS | Status: AC
  Filled 2013-11-28: qty 20

## 2013-11-28 MED ORDER — METOCLOPRAMIDE HCL 10 MG PO TABS: 5.0000 mg | ORAL_TABLET | Freq: Three times a day (TID) | ORAL | Status: DC | PRN

## 2013-11-28 MED ORDER — HYDROMORPHONE HCL 2 MG PO TABS
2.0000 mg | ORAL_TABLET | ORAL | Status: DC | PRN
  Administered 2013-11-28: 4 mg via ORAL
  Filled 2013-11-28: qty 2

## 2013-11-28 MED ORDER — FENTANYL CITRATE 0.05 MG/ML IJ SOLN
INTRAMUSCULAR | Status: AC
  Filled 2013-11-28: qty 5

## 2013-11-28 MED ORDER — SUCCINYLCHOLINE CHLORIDE 20 MG/ML IJ SOLN
INTRAMUSCULAR | Status: DC | PRN
  Administered 2013-11-28: 100 mg via INTRAVENOUS

## 2013-11-28 MED ORDER — HYDROMORPHONE HCL ER 8 MG PO T24A
8.0000 mg | EXTENDED_RELEASE_TABLET | Freq: Every day | ORAL | Status: DC
  Administered 2013-11-28: 8 mg via ORAL
  Filled 2013-11-28: qty 1

## 2013-11-28 MED ORDER — LIDOCAINE HCL (CARDIAC) 20 MG/ML IV SOLN
INTRAVENOUS | Status: AC
  Filled 2013-11-28: qty 5

## 2013-11-28 MED ORDER — LACTATED RINGERS IV SOLN
INTRAVENOUS | Status: DC
  Administered 2013-11-28: 09:00:00 via INTRAVENOUS

## 2013-11-28 MED ORDER — DOCUSATE SODIUM 100 MG PO CAPS
100.0000 mg | ORAL_CAPSULE | Freq: Two times a day (BID) | ORAL | Status: DC
  Administered 2013-11-28 – 2013-11-29 (×2): 100 mg via ORAL
  Filled 2013-11-28 (×3): qty 1

## 2013-11-28 MED ORDER — BUPIVACAINE-EPINEPHRINE PF 0.5-1:200000 % IJ SOLN
INTRAMUSCULAR | Status: DC | PRN
  Administered 2013-11-28: 30 mL via PERINEURAL

## 2013-11-28 MED ORDER — GLYCOPYRROLATE 0.2 MG/ML IJ SOLN
INTRAMUSCULAR | Status: DC | PRN
  Administered 2013-11-28: 0.2 mg via INTRAVENOUS

## 2013-11-28 MED ORDER — ONDANSETRON HCL 4 MG/2ML IJ SOLN
INTRAMUSCULAR | Status: DC | PRN
  Administered 2013-11-28: 4 mg via INTRAVENOUS

## 2013-11-28 SURGICAL SUPPLY — 69 items
ASSEMBLY NECK TAPER FIXED 135 (Orthopedic Implant) ×2 IMPLANT
BIT DRILL 5/64X5 DISP (BIT) IMPLANT
BLADE SAW SAG 73X25 THK (BLADE) ×1
BLADE SAW SGTL 73X25 THK (BLADE) ×1 IMPLANT
BLADE SURG 15 STRL LF DISP TIS (BLADE) ×1 IMPLANT
BLADE SURG 15 STRL SS (BLADE) ×1
CEMENT BONE DEPUY (Cement) ×2 IMPLANT
CHLORAPREP W/TINT 26ML (MISCELLANEOUS) ×4 IMPLANT
CLSR STERI-STRIP ANTIMIC 1/2X4 (GAUZE/BANDAGES/DRESSINGS) ×2 IMPLANT
COVER SURGICAL LIGHT HANDLE (MISCELLANEOUS) ×2 IMPLANT
DRAPE INCISE IOBAN 66X45 STRL (DRAPES) ×4 IMPLANT
DRAPE SURG 17X23 STRL (DRAPES) ×2 IMPLANT
DRAPE U-SHAPE 47X51 STRL (DRAPES) ×2 IMPLANT
DRILL BIT 5/64 (BIT) ×2 IMPLANT
DRSG MEPILEX BORDER 4X12 (GAUZE/BANDAGES/DRESSINGS) ×2 IMPLANT
DRSG MEPILEX BORDER 4X4 (GAUZE/BANDAGES/DRESSINGS) ×2 IMPLANT
DRSG MEPILEX BORDER 4X8 (GAUZE/BANDAGES/DRESSINGS) ×2 IMPLANT
ELECT BLADE 4.0 EZ CLEAN MEGAD (MISCELLANEOUS)
ELECT REM PT RETURN 9FT ADLT (ELECTROSURGICAL) ×2
ELECTRODE BLDE 4.0 EZ CLN MEGD (MISCELLANEOUS) IMPLANT
ELECTRODE REM PT RTRN 9FT ADLT (ELECTROSURGICAL) ×1 IMPLANT
EVACUATOR 1/8 PVC DRAIN (DRAIN) IMPLANT
GLENOID ANCHOR PEG CROSSLK 44 (Orthopedic Implant) ×2 IMPLANT
GLOVE BIO SURGEON STRL SZ7 (GLOVE) ×2 IMPLANT
GLOVE BIO SURGEON STRL SZ7.5 (GLOVE) ×2 IMPLANT
GLOVE BIOGEL PI IND STRL 7.0 (GLOVE) ×1 IMPLANT
GLOVE BIOGEL PI IND STRL 8 (GLOVE) ×1 IMPLANT
GLOVE BIOGEL PI INDICATOR 7.0 (GLOVE) ×1
GLOVE BIOGEL PI INDICATOR 8 (GLOVE) ×1
GOWN PREVENTION PLUS LG XLONG (DISPOSABLE) ×2 IMPLANT
GOWN STRL REIN XL XLG (GOWN DISPOSABLE) ×2 IMPLANT
HANDPIECE INTERPULSE COAX TIP (DISPOSABLE) ×1
HEAD HUMERAL ECC 44X18MM SHLDR (Head) ×2 IMPLANT
HEMOSTAT SURGICEL 2X14 (HEMOSTASIS) IMPLANT
HOOD PEEL AWAY FACE SHEILD DIS (HOOD) ×4 IMPLANT
KIT BASIN OR (CUSTOM PROCEDURE TRAY) ×2 IMPLANT
KIT ROOM TURNOVER OR (KITS) ×2 IMPLANT
MANIFOLD NEPTUNE II (INSTRUMENTS) ×2 IMPLANT
NEEDLE HYPO 25GX1X1/2 BEV (NEEDLE) IMPLANT
NEEDLE MAYO TROCAR (NEEDLE) ×2 IMPLANT
NS IRRIG 1000ML POUR BTL (IV SOLUTION) ×2 IMPLANT
PACK SHOULDER (CUSTOM PROCEDURE TRAY) ×2 IMPLANT
PAD ARMBOARD 7.5X6 YLW CONV (MISCELLANEOUS) ×4 IMPLANT
PIN METAGLENE 2.5 (PIN) ×2 IMPLANT
RETRIEVER SUT HEWSON (MISCELLANEOUS) IMPLANT
SET HNDPC FAN SPRY TIP SCT (DISPOSABLE) ×1 IMPLANT
SLING ARM IMMOBILIZER LRG (SOFTGOODS) ×2 IMPLANT
SLING ARM IMMOBILIZER MED (SOFTGOODS) IMPLANT
SMARTMIX MINI TOWER (MISCELLANEOUS) ×2
SPONGE LAP 18X18 X RAY DECT (DISPOSABLE) ×2 IMPLANT
SPONGE LAP 4X18 X RAY DECT (DISPOSABLE) IMPLANT
STEM AP PC10 (Stem) ×2 IMPLANT
STRIP CLOSURE SKIN 1/2X4 (GAUZE/BANDAGES/DRESSINGS) ×2 IMPLANT
SUCTION FRAZIER TIP 10 FR DISP (SUCTIONS) ×2 IMPLANT
SUPPORT WRAP ARM LG (MISCELLANEOUS) ×2 IMPLANT
SUT ETHIBOND NAB CT1 #1 30IN (SUTURE) ×2 IMPLANT
SUT FIBERWIRE #2 38 T-5 BLUE (SUTURE) ×6
SUT MNCRL AB 4-0 PS2 18 (SUTURE) ×2 IMPLANT
SUT SILK 2 0 TIES 17X18 (SUTURE)
SUT SILK 2-0 18XBRD TIE BLK (SUTURE) IMPLANT
SUT VIC AB 0 CTB1 27 (SUTURE) IMPLANT
SUT VIC AB 2-0 CT1 27 (SUTURE) ×1
SUT VIC AB 2-0 CT1 TAPERPNT 27 (SUTURE) ×1 IMPLANT
SUTURE FIBERWR #2 38 T-5 BLUE (SUTURE) ×3 IMPLANT
SYR CONTROL 10ML LL (SYRINGE) IMPLANT
TOWEL OR 17X24 6PK STRL BLUE (TOWEL DISPOSABLE) ×2 IMPLANT
TOWEL OR 17X26 10 PK STRL BLUE (TOWEL DISPOSABLE) ×2 IMPLANT
TOWER SMARTMIX MINI (MISCELLANEOUS) ×1 IMPLANT
WATER STERILE IRR 1000ML POUR (IV SOLUTION) ×2 IMPLANT

## 2013-11-28 NOTE — Discharge Instructions (Signed)
Discharge Instructions after Total Shoulder Arthroplasty ° ° °A sling has been provided for you. Remove the sling 5 times each day to perform motion exercises. After the first 48 to 72 hours, discontinue using the sling. You should use the sling as a protective device, if you are in a crowd.  °Use ice on the shoulder intermittently over the first 48 hours after surgery.  °Pain medication has been prescribed for you.  °Use your medication liberally over the first 48 hours, and then begin to taper your use. You may take Extra Strength Tylenol or Tylenol only in place of the pain pills. DO NOT take ANY nonsteroidal anti-inflammatory pain medications: Advil, Motrin, Ibuprofen, Aleve, Naproxen, or Naprosyn. °Take one aspirin a day for 2 weeks after surgery, unless you have an aspirin sensitivity/allergy or asthma. °You may remove your dressing after two days.  °You may shower 5 days after surgery. The incision CANNOT get wet prior to 5 days. Simply allow the water to wash over the site and then pat dry. Do not rub the incision. Make sure your axilla (armpit) is completely dry after showering.  °Active reaching and lifting are not permitted. You may use the operative arm for activities of daily living that do not require the operative arm to leave the side of the body, such as eating, drinking, bathing, etc.  °Three to 5 times each day you should perform assisted overhead reaching and external rotation (outward turning) exercises with the operative arm. You were taught these exercises prior to discharge. Both exercises should be done with the non-operative arm used as the "therapist arm" while the operative arm remains relaxed. Ten of each exercise should be done three to five times each day. ° ° °Overhead reach is helping to lift your stiff arm up as high as it will go. To stretch your overhead reach, lie flat on your back, relax, and grasp the wrist of the tight shoulder with your opposite hand. Using the power in your  opposite arm, bring the stiff arm up as far as it is comfortable. Start holding it for ten seconds and then work up to where you can hold it for a count of 30. Breathe slowly and deeply while the arm is moved. Repeat this stretch ten times, trying to help the ar up a little higher each time.  ° ° ° °External rotation is turning the arm out to the side while your elbow stays close to your body. External rotation is best stretched while you are lying on your back. Hold a cane, yardstick, broom handle, or dowel in both hands. Bend both elbows to a right angle. Use steady, gentle force from your normal arm to rotate the hand of the stiff shoulder out away from your body. Continue the rotation as far as it will go comfortably, holding it there for a count of 10. Repeat this exercise ten times.  ° ° ° ° °Please call [phone] during normal business hours or [phone] after hours for any problems. Including the following: ° °- excessive redness of the incisions °- drainage for more than 4 days °- fever of more than 101.5 F ° °*Please note that pain medications will not be refilled after hours or on weekends. ° ° ° °

## 2013-11-28 NOTE — Anesthesia Procedure Notes (Addendum)
Anesthesia Regional Block:  Interscalene brachial plexus block  Pre-Anesthetic Checklist: ,, timeout performed, Correct Patient, Correct Site, Correct Laterality, Correct Procedure, Correct Position, site marked, Risks and benefits discussed,  Surgical consent,  Pre-op evaluation,  At surgeon's request and post-op pain management  Laterality: Right  Prep: chloraprep       Needles:  Injection technique: Single-shot  Needle Type: Echogenic Stimulator Needle     Needle Length: 9cm 9 cm Needle Gauge: 21 and 21 G    Additional Needles:  Procedures: ultrasound guided (picture in chart) and nerve stimulator Interscalene brachial plexus block  Nerve Stimulator or Paresthesia:  Response: 0.4 mA,   Additional Responses:   Narrative:  Start time: 11/28/2013 9:32 AM End time: 11/28/2013 9:42 AM Injection made incrementally with aspirations every 5 mL.  Performed by: Personally  Anesthesiologist: Lillia Abed MD  Additional Notes: Monitors applied. Patient sedated. Sterile prep and drape,hand hygiene and sterile gloves were used. Relevant anatomy identified.Needle position confirmed.Local anesthetic injected incrementally after negative aspiration. Local anesthetic spread visualized around nerve(s). Vascular puncture avoided. No complications. Image printed for medical record.The patient tolerated the procedure well.        Procedure Name: Intubation Date/Time: 11/28/2013 11:18 AM Performed by: Trixie Deis A Pre-anesthesia Checklist: Patient identified, Timeout performed, Emergency Drugs available, Suction available and Patient being monitored Patient Re-evaluated:Patient Re-evaluated prior to inductionOxygen Delivery Method: Circle system utilized Preoxygenation: Pre-oxygenation with 100% oxygen Intubation Type: IV induction Ventilation: Mask ventilation without difficulty Laryngoscope Size: Mac and 3 Grade View: Grade II Tube type: Oral Tube size: 7.0 mm Number of attempts:  1 Airway Equipment and Method: Stylet Placement Confirmation: ETT inserted through vocal cords under direct vision,  positive ETCO2 and breath sounds checked- equal and bilateral Secured at: 21 cm Tube secured with: Tape Dental Injury: Teeth and Oropharynx as per pre-operative assessment

## 2013-11-28 NOTE — Progress Notes (Signed)
I spoke with Dr Tamera Punt and he will view XRAY & report. No new orders. Pt fine, no c/o, still waiting for a bed.

## 2013-11-28 NOTE — Anesthesia Postprocedure Evaluation (Signed)
Anesthesia Post Note  Patient: Lisa Sherman  Procedure(s) Performed: Procedure(s) (LRB): TOTAL SHOULDER ARTHROPLASTY (Right)  Anesthesia type: General  Patient location: PACU  Post pain: Pain level controlled and Adequate analgesia  Post assessment: Post-op Vital signs reviewed, Patient's Cardiovascular Status Stable, Respiratory Function Stable, Patent Airway and Pain level controlled  Last Vitals:  Filed Vitals:   11/28/13 1530  BP: 124/62  Pulse: 74  Temp:   Resp: 19    Post vital signs: Reviewed and stable  Level of consciousness: awake, alert  and oriented  Complications: No apparent anesthesia complications

## 2013-11-28 NOTE — H&P (Signed)
Berneita Sanagustin is an 73 y.o. female.   Chief Complaint: R shoulder pain and dysfunction HPI: R shoulder pain and dysfunction x several years.  Worsening, not responsive to NSAIDs, injections, activity modification.  Past Medical History  Diagnosis Date  . Cancer     hx of lung cancer - surgery - chemo - radiation- all completed july 2009  . Pneumonia     yrs ago  . GERD (gastroesophageal reflux disease)   . Arthritis     severe oa right hip; also has arthritis both shoulders-limited rom.  . Complication of anesthesia     slow getting anesthesia out of system    Past Surgical History  Procedure Laterality Date  . Removal upper right lobe lung for cancer -2009 - surgery at [facility]    . 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 Abdominal hysterectomy  1996  . Total hip arthroplasty Right 10/11/2013    Procedure: RIGHT TOTAL HIP ARTHROPLASTY ANTERIOR APPROACH;  Surgeon: Mcarthur Rossetti, MD;  Location: [facility] ORS;  Service: Orthopedics;  Laterality: Right;  . Tonsillectomy    . Eye surgery Bilateral     cataracts with lens  . Colonoscopy w/ biopsies and polypectomy      History reviewed. No pertinent family history. Social History:  reports that she quit smoking about 37 years ago. Her smoking use included Cigarettes. She has a 20 pack-year smoking history. She has never used smokeless tobacco. She reports that she drinks alcohol. She reports that she does not use illicit drugs.  Allergies: No Known Allergies  Medications Prior to Admission  Medication Sig Dispense Refill  . cabergoline (DOSTINEX) 0.5 MG tablet Take 0.5 mg by mouth 2 (two) times a week. Monday, and fridays      . celecoxib (CELEBREX) 100 MG capsule Take 100 mg by mouth daily.      . D-Mannose POWD Take 5 mLs by mouth 2 (two) times daily.      Marland Kitchen HYDROmorphone (DILAUDID) 2 MG tablet Take 2 mg by mouth every 4 (four) hours as needed for moderate pain or severe pain.      Marland Kitchen  HYDROmorphone HCl (EXALGO) 8 MG T24A SR tablet Take 8 mg by mouth at bedtime.      Marland Kitchen MAGNESIUM CITRATE PO Take 270 mg by mouth 2 (two) times daily. 2 capsules      . niacin 100 MG tablet Take 100 mg by mouth daily.      Marland Kitchen OVER THE COUNTER MEDICATION Take 2 tablets by mouth 2 (two) times daily. Metaboliczyme      . OVER THE COUNTER MEDICATION Apply 1 application topically daily. Pregnenolone Cream      . OVER THE COUNTER MEDICATION Take 1 tablet by mouth daily. ThioDox      . OVER THE COUNTER MEDICATION Take 10 drops by mouth daily. Vitamin D/K12      . OVER THE COUNTER MEDICATION Take 5 mLs by mouth daily. MCT oil      . OVER THE COUNTER MEDICATION Take 1 capsule by mouth daily. Prescript - assist probiotic      . OVER THE COUNTER MEDICATION Take 1 capsule by mouth 2 (two) times daily. Methylation Support Formula      . OVER THE COUNTER MEDICATION Take 1 capsule by mouth 2 (two) times daily. Biost      . OVER THE COUNTER MEDICATION Take 15 drops by mouth 2 (two) times daily. Apo-Dolor      .  OVER THE COUNTER MEDICATION Take 15 drops by mouth 2 (two) times daily. Itires      . Zinc 15 MG CAPS Take 15 mg by mouth 2 (two) times daily.        No results found for this or any previous visit (from the past 48 hour(s)). No results found.  Review of Systems  All other systems reviewed and are negative.    Blood pressure 123/65, pulse 62, temperature 98.1 F (36.7 C), temperature source Oral, resp. rate 21, SpO2 100.00%. Physical Exam  Constitutional: She is oriented to person, place, and time. She appears well-developed and well-nourished.  HENT:  Head: Atraumatic.  Eyes: EOM are normal.  Cardiovascular: Intact distal pulses.   Respiratory: Effort normal.  Musculoskeletal:  R shoulder pain with limited ROM.  Neurological: She is alert and oriented to person, place, and time.  Skin: Skin is warm and dry.  Psychiatric: She has a normal mood and affect.     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

## 2013-11-28 NOTE — Progress Notes (Signed)
Xray called post op report to me to bring to Dr Bettina Gavia attention. He is in surgery, I read report to OR RN Sheryl and she is able to view it on EPIC & she will update MD. Pt has no c/o and is comfortable.

## 2013-11-28 NOTE — Transfer of Care (Signed)
Immediate Anesthesia Transfer of Care Note  Patient: Lisa Sherman  Procedure(s) Performed: Procedure(s): TOTAL SHOULDER ARTHROPLASTY (Right)  Patient Location: PACU  Anesthesia Type:General  Level of Consciousness: awake, alert  and oriented  Airway & Oxygen Therapy: Patient Spontanous Breathing and Patient connected to nasal cannula oxygen  Post-op Assessment: Report given to PACU RN, Post -op Vital signs reviewed and stable and Patient moving all extremities  Post vital signs: Reviewed and stable  Complications: No apparent anesthesia complications and voice still hoarse postoperatively

## 2013-11-28 NOTE — Op Note (Signed)
Procedure(s): TOTAL SHOULDER ARTHROPLASTY Procedure Note  Lisa Sherman female 73 y.o. 11/28/2013  Procedure(s) and Anesthesia Type:    * RIGHT TOTAL SHOULDER ARTHROPLASTY - Regional  Surgeon(s) and Role:    * Nita Sells, MD - Primary   Indications:  73 y.o. female  With endstage right shoulder arthritis. Pain and dysfunction interfered with quality of life and nonoperative treatment with activity modification, NSAIDS and injections failed.     Surgeon: Nita Sells   Assistants: Jeanmarie Hubert PA-C [facility] was present and scrubbed throughout the procedure and was essential in positioning, retraction, exposure, and closure)  Anesthesia: General endotracheal anesthesia with preoperative interscalene block    Procedure Detail  TOTAL SHOULDER ARTHROPLASTY  Findings: DePuy size 10 press-fit proximally porous coated stem with a 44 x 18 eccentric head, 44 anchor peg glenoid cemented. The subscapularis was taken down as one layer with capsule  Estimated Blood Loss:  200 mL         Drains: 1 medium hemovac  Blood Given: none          Specimens: none        Complications:  * No complications entered in OR log *         Disposition: PACU - hemodynamically stable.         Condition: stable    Procedure:   The patient was identified in the preoperative holding area where I personally marked the operative extremity after verifying with the patient and consent. She  was taken to the operating room where She was transferred to the   operative table.  The patient received an interscalene block in   the holding area by the attending anesthesiologist.  General anesthesia was induced   in the operating room without complication.  The patient did receive IV  Ancef prior to the commencement of the procedure.  The patient was   placed in the beach-chair position with the back raised about 30   degrees.  The nonoperative extremity and head and neck were  carefully   positioned and padded protecting against neurovascular compromise.  The   left upper extremity was then prepped and draped in the standard sterile   fashion.    The appropriate operative time-out was performed with   Anesthesia, the perioperative staff, as well as myself and we all agreed   that the right side was the correct operative site.  An approximately   10 cm incision was made from the tip of the coracoid to the center point of the   humerus at the level of the axilla.  Dissection was carried down sharply   through subcutaneous tissues and cephalic vein was identified and taken   laterally with the deltoid.  The pectoralis major was taken medially.  The   upper 1 cm of the pectoralis major was released from its attachment on   the humerus.  The clavipectoral fascia was incised just lateral to the   conjoined tendon.  This incision was carried up to but not into the   coracoacromial ligament.  Digital palpation was used to prove   integrity of the axillary nerve which was protected throughout the   procedure.  Musculocutaneous nerve was not palpated in the operative   field.  Conjoined tendon was then retracted gently medially and the   deltoid laterally.  Anterior circumflex humeral vessels were clamped and   coagulated.  The soft tissues overlying the biceps was incised and this   incision was carried  across the transverse humeral ligament to the base   of the coracoid.  The biceps was tenodesed to the soft tissue just above   pectoralis major and the remaining portion of the biceps superiorly was   excised.  The subscapularis was taken down off of the lesser tuberosity in one layer with the underlying capsule with progressive external rotation. Capsule was    released all the way down to the 6 o'clock position of the humeral head.   The humeral head was then delivered with simultaneous adduction,   extension and external rotation.  All humeral osteophytes were removed    and the anatomic neck of the humerus was marked and cut free hand at   approximately 25 degrees retroversion within about 3 mm of the cuff   reflection posteriorly.  The head size was estimated to be a 44 medium   offset.  At that point, the humeral head was retracted posteriorly with   a Fukuda retractor.   Remaining portion of the capsule was released at the base of the   coracoid.  The remaining biceps anchor and the entire anterior-inferior   labrum was excised.  The posterior labrum was also excised but the   posterior capsule was not released.  The guidepin was placed bicortically with 44 +0 elevated guide.  The reamer was used to ream to concentric bone with punctate bleeding.  This gave an excellent concentric surface.  The center hole was then drilled for an anchor peg glenoid followed by the three peripheral holes and only the posterior inferior hole  exited the glenoid wall.  The glenoid bone was noted to be quite sclerotic. I then pulse irrigated these holes and dried   them with Surgicel.  The three peripheral holes were then   pressurized cemented, except the posterior inferior hole which was not pressurized, and the anchor peg glenoid was placed and impacted   with an excellent fit.  The glenoid was a 44 component.  The proximal humerus was then again exposed taking care not to displace the glenoid.    The humerus was then sequentially reamed going from 6 to 10 by 2 mm incriments. The 10 mm reamer was found to have appropriate cortical contact.  A   box osteotome was then used and a 10-mm broach.  The broach handle was   removed and the trial head was placed.   Calcar reamer was used.The eccentric 44 x 15 head fit best.  With the trial implantation of the component, however, the joint was a bit loose and tended towards posterior subluxation. Therefore a 44 x 18 head was trialed, and there was approximately 50% posterior translation with immediate snap back to the   anatomic  position.  With forward elevation, there was no tendency   towards posterior subluxation.   It was felt that this implant provided the best combination of stability and motion. The trial was removed and the final implant was prepared on a back table.    Small holes were drilled on both sides of the lesser tuberosity osteotomy, through which 3 fiber tapes were passed. The implant was then placed through the loop of all 3 fiber tapes and impacted with an excellent press-fit. This achieved excellent anatomic reconstruction of the proximal humerus.  The joint was then copiously irrigated with pulse lavage.  The subscapularis was then repaired using the 3 fiber tapes previously passed.   One #1 Ethibond was placed at the rotator interval just above  the lesser tuberosity. After repair of the lesser tuberosity, a medium   Hemovac was placed out anterolaterally and again copious irrigation was   used.   At the conclusion of the procedure and easily externally rotate her to 45 without undue tension on the subscapularis repair. Skin was closed with 2-0 Vicryl sutures in the deep dermal layer and 4-0 Monocryl in a subcuticular  running fashion.  Sterile dressings were then applied including Steri- Strips, 4x4s, ABDs and tape.  The patient was placed in a sling and allowed to awaken from general anesthesia and taken to the recovery room in stable condition.      POSTOPERATIVE PLAN:  Early passive range of motion will be allowed with the goal of 40 degrees external rotation and 140 degrees forward elevation.  No internal rotation at this time.  No active motion of the arm until the subscapularis heals.  The patient will likely be kept in the hospital for 1-2 days and then discharged home.

## 2013-11-28 NOTE — Anesthesia Preprocedure Evaluation (Signed)
Anesthesia Evaluation  Patient identified by MRN, date of birth, ID band Patient awake    Reviewed: Allergy & Precautions, H&P , NPO status , Patient's Chart, lab work & pertinent test results  Airway Mallampati: I TM Distance: >3 FB Neck ROM: Full    Dental   Pulmonary former smoker,          Cardiovascular     Neuro/Psych    GI/Hepatic GERD-  Medicated and Controlled,  Endo/Other    Renal/GU      Musculoskeletal   Abdominal   Peds  Hematology   Anesthesia Other Findings   Reproductive/Obstetrics                           Anesthesia Physical Anesthesia Plan  ASA: II  Anesthesia Plan: General   Post-op Pain Management:    Induction: Intravenous  Airway Management Planned: Oral ETT  Additional Equipment:   Intra-op Plan:   Post-operative Plan: Extubation in OR  Informed Consent: I have reviewed the patients History and Physical, chart, labs and discussed the procedure including the risks, benefits and alternatives for the proposed anesthesia with the patient or authorized representative who has indicated his/her understanding and acceptance.     Plan Discussed with: CRNA and Surgeon  Anesthesia Plan Comments:         Anesthesia Quick Evaluation

## 2013-11-28 NOTE — Progress Notes (Signed)
Utilization review completed.  

## 2013-11-28 NOTE — Plan of Care (Signed)
Problem: Consults Goal: Diagnosis- Total Joint Replacement Shoulder Arthroplasty: Right

## 2013-11-29 ENCOUNTER — Encounter (HOSPITAL_COMMUNITY): Payer: Self-pay | Admitting: Orthopedic Surgery

## 2013-11-29 LAB — CBC
HCT: 32.1 % — ABNORMAL LOW (ref 36.0–46.0)
Hemoglobin: 10.6 g/dL — ABNORMAL LOW (ref 12.0–15.0)
MCH: 32.4 pg (ref 26.0–34.0)
MCHC: 33 g/dL (ref 30.0–36.0)
MCV: 98.2 fL (ref 78.0–100.0)
PLATELETS: 189 10*3/uL (ref 150–400)
RBC: 3.27 MIL/uL — ABNORMAL LOW (ref 3.87–5.11)
RDW: 14.3 % (ref 11.5–15.5)
WBC: 8.2 10*3/uL (ref 4.0–10.5)

## 2013-11-29 MED ORDER — HYDROMORPHONE HCL 2 MG PO TABS: ORAL_TABLET | ORAL | Status: AC

## 2013-11-29 NOTE — Discharge Summary (Signed)
Patient ID: Lisa Sherman MRN: [id number] DOB/AGE: 1941-08-28 73 y.o.  Admit date: 11/28/2013 Discharge date: 11/29/2013  Admission Diagnoses:  Active Problems:   Arthritis of shoulder region, right   Discharge Diagnoses:  Same  Past Medical History  Diagnosis Date  . Cancer     hx of lung cancer - surgery - chemo - radiation- all completed july 2009  . Pneumonia     yrs ago  . GERD (gastroesophageal reflux disease)   . Arthritis     severe oa right hip; also has arthritis both shoulders-limited rom.  . Complication of anesthesia     slow getting anesthesia out of system    Surgeries: Procedure(s): TOTAL SHOULDER ARTHROPLASTY on 11/28/2013   Consultants:    Discharged Condition: Improved  Hospital Course: Lisa Sherman is an 73 y.o. female who was admitted 11/28/2013 for operative treatment of right shoulder glenohumeral arthritis. Patient has severe unremitting pain that affects sleep, daily activities, and work/hobbies. After pre-op clearance the patient was taken to the operating room on 11/28/2013 and underwent  Procedure(s): TOTAL SHOULDER ARTHROPLASTY.    Patient was given perioperative antibiotics: Anti-infectives   Start     Dose/Rate Route Frequency Ordered Stop   11/28/13 1800  ceFAZolin (ANCEF) IVPB 1 g/50 mL premix     1 g 100 mL/hr over 30 Minutes Intravenous Every 6 hours 11/28/13 1732 11/29/13 0547   11/28/13 0600  ceFAZolin (ANCEF) IVPB 2 g/50 mL premix     2 g 100 mL/hr over 30 Minutes Intravenous On call to O.R. 11/27/13 1429 11/28/13 1119       Patient was given sequential compression devices, early ambulation, and ASA 325mg  BID to prevent DVT.  Patient benefited maximally from hospital stay and there were no complications.    Recent vital signs: Patient Vitals for the past 24 hrs:  BP Temp Temp src Pulse Resp SpO2  11/29/13 0642 105/76 mmHg 98.7 F (37.1 C) - 92 18 99 %  11/28/13 2106 102/75 mmHg 98.3 F (36.8 C) - 95 18 99 %  11/28/13 1710  113/62 mmHg 98 F (36.7 C) Axillary 69 16 97 %  11/28/13 1659 - 98.1 F (36.7 C) - - - -  11/28/13 1656 - - - 90 22 95 %  11/28/13 1630 101/57 mmHg - - 62 20 93 %  11/28/13 1530 124/62 mmHg - - 74 19 94 %  11/28/13 1515 122/69 mmHg - - 73 20 96 %  11/28/13 1500 115/65 mmHg - - 65 16 97 %     Recent laboratory studies:  Recent Labs  11/29/13 0355  WBC 8.2  HGB 10.6*  HCT 32.1*  PLT 189     Discharge Medications:     Medication List         cabergoline 0.5 MG tablet  Commonly known as:  DOSTINEX  Take 0.5 mg by mouth 2 (two) times a week. Monday, and fridays     celecoxib 100 MG capsule  Commonly known as:  CELEBREX  Take 100 mg by mouth daily.     D-Mannose Powd  Take 5 mLs by mouth 2 (two) times daily.     HYDROmorphone HCl 8 MG T24a SR tablet  Commonly known as:  EXALGO  Take 8 mg by mouth at bedtime.     HYDROmorphone 2 MG tablet  Commonly known as:  DILAUDID  Take 1-2 pills every 4 hours as needed for pain     MAGNESIUM CITRATE PO  Take 270 mg by  mouth 2 (two) times daily. 2 capsules     niacin 100 MG tablet  Take 100 mg by mouth daily.     OVER THE COUNTER MEDICATION  Take 2 tablets by mouth 2 (two) times daily. Metaboliczyme     OVER THE COUNTER MEDICATION  Apply 1 application topically daily. Pregnenolone Cream     OVER THE COUNTER MEDICATION  Take 1 tablet by mouth daily. ThioDox     OVER THE COUNTER MEDICATION  Take 10 drops by mouth daily. Vitamin D/K12     OVER THE COUNTER MEDICATION  Take 5 mLs by mouth daily. MCT oil     OVER THE COUNTER MEDICATION  Take 1 capsule by mouth daily. Prescript - assist probiotic     OVER THE COUNTER MEDICATION  Take 1 capsule by mouth 2 (two) times daily. Methylation Support Formula     OVER THE COUNTER MEDICATION  Take 1 capsule by mouth 2 (two) times daily. Biost     OVER THE COUNTER MEDICATION  Take 15 drops by mouth 2 (two) times daily. Apo-Dolor     OVER THE COUNTER MEDICATION  Take 15 drops  by mouth 2 (two) times daily. Itires     Zinc 15 MG Caps  Take 15 mg by mouth 2 (two) times daily.        Diagnostic Studies: Dg Chest 2 View  11/20/2013   CLINICAL DATA:  Preoperative evaluation for right shoulder arthroplasty, history lung cancer, GERD  EXAM: CHEST  2 VIEW  COMPARISON:  08/11/2013, PET-CT 08/14/2013, CT chest 11/06/2013  FINDINGS: Normal heart size and pulmonary vascularity.  Tortuous thoracic aorta.  Soft tissue mass at superior aspect of right hilum again identified corresponding to known tumor.  Pleural thickening at right apex unchanged.  Underlying COPD and postoperative changes of the right upper lobe.  No acute infiltrate, pleural effusion or pneumothorax.  Bilateral glenohumeral degenerative changes and osseous demineralization.  IMPRESSION: COPD changes with soft tissue mass at superior aspect of right hilum compatible with known tumor.  Postoperative scarring right upper lobe.  No acute abnormalities.   Electronically Signed   By: Lavonia Dana M.D.   On: 11/20/2013 13:59   Dg Shoulder Right Port  11/28/2013   CLINICAL DATA:  Post shoulder replacement.  History of lung cancer.  EXAM: PORTABLE RIGHT SHOULDER - 2+ VIEW  COMPARISON:  11/20/2013 chest x-ray.  11/06/2013 chest CT.  FINDINGS: Post reverse right total shoulder replacement. There is a subtle lucency overlying the scapular which may represent overlying structures however, followup examination to exclude postoperative subtle fracture recommended.  Right lung mass better demonstrated on recent CT.  Thoracic spine scoliosis.  IMPRESSION: Post reverse right total shoulder replacement. There is a subtle lucency overlying the scapular which may represent overlying structures however, followup examination to exclude postoperative subtle fracture recommended.  This is a call report.   Electronically Signed   By: Chauncey Cruel M.D.   On: 11/28/2013 14:53    Disposition: 01-Home or Self Care      Discharge Orders   Future  Orders Complete By Expires   Call MD / Call 911  As directed    Comments:     If you experience chest pain or shortness of breath, CALL 911 and be transported to the hospital emergency room.  If you develope a fever above 101 F, pus (white drainage) or increased drainage or redness at the wound, or calf pain, call your surgeon's office.   Constipation Prevention  As directed    Comments:     Drink plenty of fluids.  Prune juice may be helpful.  You may use a stool softener, such as Colace (over the counter) 100 mg twice a day.  Use MiraLax (over the counter) for constipation as needed.   Diet - low sodium heart healthy  As directed    Increase activity slowly as tolerated  As directed       Follow-up Information   Follow up with Nita Sells, MD. Schedule an appointment as soon as possible for a visit in 2 weeks.   Specialty:  Orthopedic Surgery   Contact information:   [address] [place] [address] [phone]        Signed: Grier Mitts 11/29/2013, 2:47 PM

## 2013-11-29 NOTE — Progress Notes (Signed)
PATIENT ID: Lisa Sherman   1 Day Post-Op Procedure(s) (LRB): TOTAL SHOULDER ARTHROPLASTY (Right)  Subjective: Reports soreness in right shoulder but tolerable with pain medication. No other complaints or concerns today.   Objective:  Filed Vitals:   11/29/13 0642  BP: 105/76  Pulse: 92  Temp: 98.7 F (37.1 C)  Resp: 18     Awake, alert, orientated R UE dressing c/d/i In sling Wiggles fingers, distally NVI  Labs:   Recent Labs  11/29/13 0355  HGB 10.6*   Recent Labs  11/29/13 0355  WBC 8.2  RBC 3.27*  HCT 32.1*  PLT 189  No results found for this basename: NA, K, CL, CO2, BUN, CREATININE, GLUCOSE, CALCIUM,  in the last 72 hours  Assessment and Plan: 1 day s/p right TSA Doing well with current pain mgmt, minimize IV dilaudid  Dicussed home pain mgmt, will prescribe Dilaudid 2mg  for breakthrough pain, will resume Exalgo for baseline pain rx. Script in chart Will work with PT today, FF to 140 and ER to 40 goal passively D/c once cleared by PT Follow up with Dr. Tamera Punt in 2 weeks  VTE proph: ASA 325mg  BID, SCDs

## 2013-11-29 NOTE — Evaluation (Signed)
Occupational Therapy Evaluation and Discharge Patient Details Name: Lisa Sherman MRN: [id number] DOB: Jul 12, 1941 Today's Date: 11/29/2013 Time: 1017-5102 OT Time Calculation (min): 41 min  OT Assessment / Plan / Recommendation History of present illness RTSA   Clinical Impression   This 73 yo female admitted and underwent above presents to acute OT with all education completed with her and caregiver (who will instruct other caregivers). Acute OT will sign off.    OT Assessment  Progress rehab of shoulder as ordered by MD at follow-up appointment    Follow Up Recommendations  No OT follow up       Equipment Recommendations  None recommended by OT          Precautions / Restrictions Precautions Precautions: Shoulder Shoulder Interventions: Shoulder sling/immobilizer;Off for dressing/bathing/exercises Required Braces or Orthoses: Sling Restrictions Weight Bearing Restrictions: Yes RUE Weight Bearing: Non weight bearing   Pertinent Vitals/Pain 6/10 pain in shoulder; RN made aware       Acute Rehab OT Goals Patient Stated Goal: home today  Visit Information  Last OT Received On: 11/29/13 Assistance Needed: +1 History of Present Illness: RTSA       Prior Functioning     Home Living Family/patient expects to be discharged to:: Private residence Living Arrangements: Non-relatives/Friends Available Help at Discharge: Family;Personal care attendant;Available 24 hours/day Type of Home: House Home Access: Stairs to enter [employer] of Steps: 9 Entrance Stairs-Rails: Right Home Layout: Able to live on main level with bedroom/bathroom Home Equipment: Toilet riser;Shower seat Prior Function Level of Independence: Needs assistance ADL's / Homemaking Assistance Needed: Bil bad shoulder so caregivers A her prn Comments: Had personal care attendants prior to surgery Communication Communication: No difficulties Dominant Hand: Right          Vision/Perception Vision - History Patient Visual Report: No change from baseline   Cognition  Cognition Arousal/Alertness: Awake/alert Behavior During Therapy: WFL for tasks assessed/performed Overall Cognitive Status: Within Functional Limits for tasks assessed    Extremity/Trunk Assessment Upper Extremity Assessment Upper Extremity Assessment: RUE deficits/detail RUE Deficits / Details: Shoulder surgery thsi admission RUE Coordination: decreased gross motor     Mobility Bed Mobility Overal bed mobility: Needs Assistance Bed Mobility: Supine to Sit Supine to sit: Min assist (one hand at top of middle of back and her holding onto my arm with her left arm) Transfers Overall transfer level: Needs assistance Equipment used: 1 person hand held assist Transfers: Sit to/from Stand Sit to Stand: Min guard     Exercise Other Exercises Other Exercises: 10 reps per Dr. Bettina Gavia TSA protocol (up to 140 FF and up 40 external rotation) Donning/doffing shirt without moving shoulder: Caregiver independent with task Method for sponge bathing under operated UE: Caregiver independent with task Donning/doffing sling/immobilizer: Caregiver independent with task Correct positioning of sling/immobilizer: Caregiver independent with task Pendulum exercises (written home exercise program):  (NA) ROM for elbow, wrist and digits of operated UE: Supervision/safety Sling wearing schedule (on at all times/off for ADL's): Independent Proper positioning of operated UE when showering: Independent Dressing change:  (NA) Positioning of UE while sleeping: Caregiver independent with task      End of Session OT - End of Session Equipment Utilized During Treatment:  (sling) Activity Tolerance: Patient tolerated treatment well Patient left:  (sitting EOB with caregiver in room) Nurse Communication: Patient requests pain meds       Almon Register [phone] 11/29/2013, 2:09 PM

## 2014-01-21 ENCOUNTER — Other Ambulatory Visit: Payer: Self-pay | Admitting: Orthopedic Surgery

## 2014-02-12 ENCOUNTER — Encounter (HOSPITAL_COMMUNITY): Payer: Self-pay

## 2014-02-18 ENCOUNTER — Encounter (HOSPITAL_COMMUNITY)
Admission: RE | Admit: 2014-02-18 | Discharge: 2014-02-18 | Disposition: A | Discharge status: Home / Self Care | Payer: Medicare Other | Source: Ambulatory Visit | Attending: Orthopedic Surgery | Admitting: Orthopedic Surgery

## 2014-02-18 ENCOUNTER — Ambulatory Visit (HOSPITAL_COMMUNITY)
Admission: RE | Admit: 2014-02-18 | Discharge: 2014-02-18 | Disposition: A | Discharge status: Home / Self Care | Payer: Medicare Other | Source: Ambulatory Visit | Attending: Orthopedic Surgery | Admitting: Orthopedic Surgery

## 2014-02-18 ENCOUNTER — Encounter (HOSPITAL_COMMUNITY): Payer: Self-pay | Admitting: Orthopedic Surgery

## 2014-02-18 ENCOUNTER — Encounter (HOSPITAL_COMMUNITY): Payer: Self-pay

## 2014-02-18 DIAGNOSIS — Z01818 Encounter for other preprocedural examination: Secondary | ICD-10-CM | POA: Insufficient documentation

## 2014-02-18 LAB — URINALYSIS, ROUTINE W REFLEX MICROSCOPIC
Bilirubin Urine: NEGATIVE
Glucose, UA: NEGATIVE mg/dL
Hgb urine dipstick: NEGATIVE
Ketones, ur: NEGATIVE mg/dL
Leukocytes, UA: NEGATIVE
Nitrite: NEGATIVE
PROTEIN: NEGATIVE mg/dL
Specific Gravity, Urine: 1.015 (ref 1.005–1.030)
UROBILINOGEN UA: 0.2 mg/dL (ref 0.0–1.0)
pH: 7.5 (ref 5.0–8.0)

## 2014-02-18 LAB — CBC WITH DIFFERENTIAL/PLATELET
BASOS ABS: 0 10*3/uL (ref 0.0–0.1)
Basophils Relative: 0 % (ref 0–1)
EOS ABS: 0 10*3/uL (ref 0.0–0.7)
EOS PCT: 0 % (ref 0–5)
HCT: 42.2 % (ref 36.0–46.0)
Hemoglobin: 14 g/dL (ref 12.0–15.0)
LYMPHS PCT: 15 % (ref 12–46)
Lymphs Abs: 1.3 10*3/uL (ref 0.7–4.0)
MCH: 31.8 pg (ref 26.0–34.0)
MCHC: 33.2 g/dL (ref 30.0–36.0)
MCV: 95.9 fL (ref 78.0–100.0)
Monocytes Absolute: 0.8 10*3/uL (ref 0.1–1.0)
Monocytes Relative: 8 % (ref 3–12)
NEUTROS PCT: 77 % (ref 43–77)
Neutro Abs: 7 10*3/uL (ref 1.7–7.7)
PLATELETS: 313 10*3/uL (ref 150–400)
RBC: 4.4 MIL/uL (ref 3.87–5.11)
RDW: 16.2 % — AB (ref 11.5–15.5)
WBC: 9.1 10*3/uL (ref 4.0–10.5)

## 2014-02-18 LAB — COMPREHENSIVE METABOLIC PANEL
ALT: 16 U/L (ref 0–35)
AST: 18 U/L (ref 0–37)
Albumin: 3.7 g/dL (ref 3.5–5.2)
Alkaline Phosphatase: 117 U/L (ref 39–117)
BUN: 17 mg/dL (ref 6–23)
CO2: 29 meq/L (ref 19–32)
Calcium: 10 mg/dL (ref 8.4–10.5)
Chloride: 100 mEq/L (ref 96–112)
Creatinine, Ser: 0.76 mg/dL (ref 0.50–1.10)
GFR calc Af Amer: >90 mL/min (ref >90)
GFR calc non Af Amer: 80 mL/min — ABNORMAL LOW (ref >90)
Glucose, Bld: 106 mg/dL — ABNORMAL HIGH (ref 70–99)
Potassium: 3.6 mEq/L — ABNORMAL LOW (ref 3.7–5.3)
SODIUM: 142 meq/L (ref 137–147)
TOTAL PROTEIN: 7 g/dL (ref 6.0–8.3)
Total Bilirubin: 0.4 mg/dL (ref 0.3–1.2)

## 2014-02-18 LAB — TYPE AND SCREEN
ABO/RH(D): A POS
Antibody Screen: NEGATIVE

## 2014-02-18 LAB — ABO/RH: ABO/RH(D): A POS

## 2014-02-18 LAB — PROTIME-INR
INR: 0.97 (ref 0.00–1.49)
Prothrombin Time: 12.7 seconds (ref 11.6–15.2)

## 2014-02-18 LAB — APTT: aPTT: 27 seconds (ref 24–37)

## 2014-02-18 NOTE — Pre-Procedure Instructions (Signed)
Lisa Sherman  02/18/2014   Your procedure is scheduled on: Feb 27, 2014 @ 7:30am  Report to [facility] Admitting at 5:30 AM.  Call this number if you have problems the morning of surgery: [phone]   Remember:   Do not eat food or drink liquids after midnight.   Take these medicines the morning of surgery with A SIP OF WATER: Pain pill as needed   Stop herbal medications a week prior to surgery   Do not wear jewelry, make-up or nail polish.  Do not wear lotions, powders, or perfumes. You may NOT wear deodorant.  Do not shave 48 hours prior to surgery.   Do not bring valuables to the hospital.  [facility] is not responsible   for any belongings or valuables.               Contacts, dentures or bridgework may not be worn into surgery.  Leave suitcase in the car. After surgery it may be brought to your room.  For patients admitted to the hospital, discharge time is determined by your     treatment team.               Patients discharged the day of surgery will not be allowed to drive  home.  Name and phone number of your driver:      Please read over the following fact sheets that you were given: Pain Booklet, Coughing and Deep Breathing, Blood Transfusion Information and Surgical Site Infection Prevention

## 2014-02-19 NOTE — Progress Notes (Addendum)
Anesthesia Chart Review:  Patient is a 73 year old female scheduled for left total shoulder arthroplasty on 02/27/14 by Dr. Tamera Punt.  History includes lung cancer s/p RU lobectomy and chemoradiation ([facility], 2009), GERD, arthritis, hysterectomy, right total shoulder 11/28/13, right THA 10/11/13. Smoking history was not documented at her PAT visit.  For anesthesia history, she reported "slow getting anesthesia out of system."  PCP is listed as Dr. Dustin Folks who is actually an oncologist with [facility] [phone]).  EKG on 02/18/14 showed SB at 50 bpm.  CXR on 02/18/14 showed: Right peritracheal prominence is noted concerning for possible adenopathy. Also noted are multiple nodules in the left lung concerning for metastatic disease. CT scan of the chest is recommended for further evaluation. The radiologist was going to notify Dr. Bettina Gavia office.  I left a voice mail with Angela Nevin to contact me for an update.  I want to confirm that report is being forwarded to Dr. Wendee Beavers for further recommendations. (Of note, there were no comparison films used by the radiologist. Patient did have a previous CXR on 11/20/13 with mention of comparison chest CT on 11/06/13.  Unfortunately, I cannot not get the PACS images to load, so I'm unsure of the recent chest CT findings.)  Preoperative labs noted.    I'll await follow-up from Dr. Bettina Gavia office.  George Hugh [facility] Short Stay Center/Anesthesiology Phone [phone] 02/19/2014 5:21 PM  Addendum: 02/20/2014 2:50 PM I spoke with Rise Paganini at Dr. Algis Greenhouse office this morning.  Patient is actually scheduled to see his partner Dr. Ella Jubilee tomorrow.  She asked that CXR report be faxed so Dr. Haywood Lasso can review for additional recommendations.  As above, based on the currently available information, I'm unsure if the findings are new or not.  Information faxed to [phone] included her last two chest xray reports and a letter from me reviewing CXR  findings, plans for surgery, and my and Dr. Bettina Gavia contact information if needed. I received a fax confirmation.  I also confirmed with Angela Nevin at Dr. Bettina Gavia office that he and/or his PA Andee Poles are aware of CXR findings. If no acute respiratory symptoms then CXR findings should not interfere with plans for anesthesia, so any change in surgical plans would likely depend on surgeon and/or HEM-ONC recommendations.  I also updated anesthesiologist Dr. Glennon Mac regarding CXR report.

## 2014-02-26 MED ORDER — POVIDONE-IODINE 7.5 % EX SOLN
Freq: Once | CUTANEOUS | Status: DC
  Filled 2014-02-26: qty 118

## 2014-02-26 MED ORDER — CEFAZOLIN SODIUM-DEXTROSE 2-3 GM-% IV SOLR
2.0000 g | INTRAVENOUS | Status: AC
  Administered 2014-02-27: 2 g via INTRAVENOUS

## 2014-02-27 ENCOUNTER — Encounter (HOSPITAL_COMMUNITY): Payer: Self-pay | Admitting: *Deleted

## 2014-02-27 ENCOUNTER — Inpatient Hospital Stay (HOSPITAL_COMMUNITY): Payer: Medicare Other

## 2014-02-27 ENCOUNTER — Encounter (HOSPITAL_COMMUNITY)
Admission: RE | Disposition: A | Discharge status: Home / Self Care | Payer: Self-pay | Source: Ambulatory Visit | Attending: Orthopedic Surgery

## 2014-02-27 ENCOUNTER — Inpatient Hospital Stay (HOSPITAL_COMMUNITY): Payer: Medicare Other | Admitting: Anesthesiology

## 2014-02-27 ENCOUNTER — Encounter (HOSPITAL_COMMUNITY): Payer: Medicare Other | Admitting: Vascular Surgery

## 2014-02-27 ENCOUNTER — Inpatient Hospital Stay (HOSPITAL_COMMUNITY)
Admission: RE | Admit: 2014-02-27 | Discharge: 2014-02-28 | DRG: 483 | Disposition: A | Discharge status: Home / Self Care | Payer: Medicare Other | Source: Ambulatory Visit | Attending: Orthopedic Surgery | Admitting: Orthopedic Surgery

## 2014-02-27 DIAGNOSIS — Z85118 Personal history of other malignant neoplasm of bronchus and lung: Secondary | ICD-10-CM

## 2014-02-27 DIAGNOSIS — K219 Gastro-esophageal reflux disease without esophagitis: Secondary | ICD-10-CM | POA: Diagnosis present

## 2014-02-27 DIAGNOSIS — Z923 Personal history of irradiation: Secondary | ICD-10-CM

## 2014-02-27 DIAGNOSIS — Z96649 Presence of unspecified artificial hip joint: Secondary | ICD-10-CM

## 2014-02-27 DIAGNOSIS — M1610 Unilateral primary osteoarthritis, unspecified hip: Secondary | ICD-10-CM | POA: Diagnosis present

## 2014-02-27 DIAGNOSIS — M19019 Primary osteoarthritis, unspecified shoulder: Principal | ICD-10-CM | POA: Diagnosis present

## 2014-02-27 DIAGNOSIS — Z87891 Personal history of nicotine dependence: Secondary | ICD-10-CM

## 2014-02-27 DIAGNOSIS — M169 Osteoarthritis of hip, unspecified: Secondary | ICD-10-CM | POA: Diagnosis present

## 2014-02-27 DIAGNOSIS — Z9221 Personal history of antineoplastic chemotherapy: Secondary | ICD-10-CM

## 2014-02-27 DIAGNOSIS — Z79899 Other long term (current) drug therapy: Secondary | ICD-10-CM

## 2014-02-27 DIAGNOSIS — Z96619 Presence of unspecified artificial shoulder joint: Secondary | ICD-10-CM

## 2014-02-27 HISTORY — PX: TOTAL SHOULDER ARTHROPLASTY: SHX126

## 2014-02-27 SURGERY — ARTHROPLASTY, SHOULDER, TOTAL
Anesthesia: General | Site: Shoulder | Laterality: Left

## 2014-02-27 MED ORDER — ROPIVACAINE HCL 5 MG/ML IJ SOLN
INTRAMUSCULAR | Status: DC | PRN
  Administered 2014-02-27: 150 mg via PERINEURAL

## 2014-02-27 MED ORDER — SODIUM CHLORIDE 0.9 % IR SOLN
Status: DC | PRN
Start: 2014-02-27 — End: 2014-02-27
  Administered 2014-02-27: 3000 mL

## 2014-02-27 MED ORDER — PHENOL 1.4 % MT LIQD: 1.0000 | OROMUCOSAL | Status: DC | PRN

## 2014-02-27 MED ORDER — METOCLOPRAMIDE HCL 10 MG PO TABS: 5.0000 mg | ORAL_TABLET | Freq: Three times a day (TID) | ORAL | Status: DC | PRN

## 2014-02-27 MED ORDER — OXYCODONE HCL 5 MG PO TABS: 5.0000 mg | ORAL_TABLET | Freq: Once | ORAL | Status: DC | PRN

## 2014-02-27 MED ORDER — CEFAZOLIN SODIUM 1-5 GM-% IV SOLN
1.0000 g | Freq: Four times a day (QID) | INTRAVENOUS | Status: AC
  Administered 2014-02-27 (×3): 1 g via INTRAVENOUS
  Filled 2014-02-27 (×3): qty 50

## 2014-02-27 MED ORDER — ROCURONIUM BROMIDE 100 MG/10ML IV SOLN
INTRAVENOUS | Status: DC | PRN
  Administered 2014-02-27: 10 mg via INTRAVENOUS
  Administered 2014-02-27: 40 mg via INTRAVENOUS

## 2014-02-27 MED ORDER — FLEET ENEMA 7-19 GM/118ML RE ENEM: 1.0000 | ENEMA | Freq: Once | RECTAL | Status: AC | PRN

## 2014-02-27 MED ORDER — CABERGOLINE 0.5 MG PO TABS: 0.5000 mg | ORAL_TABLET | ORAL | Status: DC

## 2014-02-27 MED ORDER — LACTATED RINGERS IV SOLN
INTRAVENOUS | Status: DC | PRN
  Administered 2014-02-27 (×2): via INTRAVENOUS

## 2014-02-27 MED ORDER — SODIUM CHLORIDE 0.9 % IV SOLN
INTRAVENOUS | Status: DC
  Administered 2014-02-27: 23:00:00 via INTRAVENOUS

## 2014-02-27 MED ORDER — HYDROMORPHONE HCL ER 8 MG PO T24A
8.0000 mg | EXTENDED_RELEASE_TABLET | Freq: Every day | ORAL | Status: DC
  Administered 2014-02-27: 8 mg via ORAL
  Filled 2014-02-27: qty 1

## 2014-02-27 MED ORDER — ASPIRIN EC 325 MG PO TBEC
325.0000 mg | DELAYED_RELEASE_TABLET | Freq: Two times a day (BID) | ORAL | Status: DC
  Administered 2014-02-27 – 2014-02-28 (×3): 325 mg via ORAL
  Filled 2014-02-27 (×5): qty 1

## 2014-02-27 MED ORDER — HYDROMORPHONE HCL PF 1 MG/ML IJ SOLN: 0.2500 mg | INTRAMUSCULAR | Status: DC | PRN

## 2014-02-27 MED ORDER — CABERGOLINE 0.5 MG PO TABS
0.5000 mg | ORAL_TABLET | ORAL | Status: DC
Start: 2014-02-27 — End: 2014-02-27

## 2014-02-27 MED ORDER — ALUMINUM HYDROXIDE GEL 320 MG/5ML PO SUSP
15.0000 mL | ORAL | Status: DC | PRN
  Filled 2014-02-27: qty 30

## 2014-02-27 MED ORDER — BISACODYL 5 MG PO TBEC: 5.0000 mg | DELAYED_RELEASE_TABLET | Freq: Every day | ORAL | Status: DC | PRN

## 2014-02-27 MED ORDER — CELECOXIB 100 MG PO CAPS
100.0000 mg | ORAL_CAPSULE | Freq: Every day | ORAL | Status: DC
  Filled 2014-02-27 (×2): qty 1

## 2014-02-27 MED ORDER — DOCUSATE SODIUM 100 MG PO CAPS
100.0000 mg | ORAL_CAPSULE | Freq: Two times a day (BID) | ORAL | Status: DC
  Administered 2014-02-27 – 2014-02-28 (×3): 100 mg via ORAL
  Filled 2014-02-27 (×4): qty 1

## 2014-02-27 MED ORDER — ONDANSETRON HCL 4 MG/2ML IJ SOLN
INTRAMUSCULAR | Status: DC | PRN
  Administered 2014-02-27: 4 mg via INTRAVENOUS

## 2014-02-27 MED ORDER — METOCLOPRAMIDE HCL 5 MG/ML IJ SOLN: 5.0000 mg | Freq: Three times a day (TID) | INTRAMUSCULAR | Status: DC | PRN

## 2014-02-27 MED ORDER — NIACIN 100 MG PO TABS
100.0000 mg | ORAL_TABLET | Freq: Every day | ORAL | Status: DC
  Filled 2014-02-27 (×2): qty 1

## 2014-02-27 MED ORDER — ONDANSETRON HCL 4 MG PO TABS: 4.0000 mg | ORAL_TABLET | Freq: Four times a day (QID) | ORAL | Status: DC | PRN

## 2014-02-27 MED ORDER — ACETAMINOPHEN 650 MG RE SUPP
650.0000 mg | Freq: Four times a day (QID) | RECTAL | Status: DC | PRN
  Filled 2014-02-27: qty 1

## 2014-02-27 MED ORDER — PROMETHAZINE HCL 25 MG/ML IJ SOLN: 6.2500 mg | INTRAMUSCULAR | Status: DC | PRN

## 2014-02-27 MED ORDER — DEXTROSE 5 % IV SOLN
INTRAVENOUS | Status: DC | PRN
  Administered 2014-02-27: 08:00:00 via INTRAVENOUS

## 2014-02-27 MED ORDER — DIPHENHYDRAMINE HCL 12.5 MG/5ML PO ELIX: 12.5000 mg | ORAL_SOLUTION | ORAL | Status: DC | PRN

## 2014-02-27 MED ORDER — ONDANSETRON HCL 4 MG/2ML IJ SOLN: 4.0000 mg | Freq: Four times a day (QID) | INTRAMUSCULAR | Status: DC | PRN

## 2014-02-27 MED ORDER — ALBUTEROL SULFATE HFA 108 (90 BASE) MCG/ACT IN AERS
INHALATION_SPRAY | RESPIRATORY_TRACT | Status: DC | PRN
  Administered 2014-02-27: 6 via RESPIRATORY_TRACT

## 2014-02-27 MED ORDER — HYDROMORPHONE HCL 2 MG PO TABS
2.0000 mg | ORAL_TABLET | ORAL | Status: DC | PRN
  Administered 2014-02-27 – 2014-02-28 (×5): 2 mg via ORAL
  Filled 2014-02-27 (×3): qty 1
  Filled 2014-02-27 (×2): qty 2

## 2014-02-27 MED ORDER — CEFAZOLIN SODIUM-DEXTROSE 2-3 GM-% IV SOLR
INTRAVENOUS | Status: AC
  Filled 2014-02-27: qty 50

## 2014-02-27 MED ORDER — EPHEDRINE SULFATE 50 MG/ML IJ SOLN
INTRAMUSCULAR | Status: DC | PRN
  Administered 2014-02-27 (×3): 5 mg via INTRAVENOUS
  Administered 2014-02-27 (×2): 10 mg via INTRAVENOUS
  Administered 2014-02-27: 5 mg via INTRAVENOUS
  Administered 2014-02-27: 10 mg via INTRAVENOUS

## 2014-02-27 MED ORDER — NEOSTIGMINE METHYLSULFATE 10 MG/10ML IV SOLN
INTRAVENOUS | Status: DC | PRN
  Administered 2014-02-27: 4 mg via INTRAVENOUS

## 2014-02-27 MED ORDER — TYLER SIMILASE SENSITIVE PO CAPS: 1.0000 | ORAL_CAPSULE | Freq: Three times a day (TID) | ORAL | Status: DC

## 2014-02-27 MED ORDER — OXYCODONE HCL 5 MG/5ML PO SOLN: 5.0000 mg | Freq: Once | ORAL | Status: DC | PRN

## 2014-02-27 MED ORDER — PROPOFOL 10 MG/ML IV BOLUS
INTRAVENOUS | Status: DC | PRN
  Administered 2014-02-27: 160 mg via INTRAVENOUS

## 2014-02-27 MED ORDER — MENTHOL 3 MG MT LOZG: 1.0000 | LOZENGE | OROMUCOSAL | Status: DC | PRN

## 2014-02-27 MED ORDER — ROCURONIUM BROMIDE 100 MG/10ML IV SOLN: INTRAVENOUS | Status: DC | PRN

## 2014-02-27 MED ORDER — FENTANYL CITRATE 0.05 MG/ML IJ SOLN
INTRAMUSCULAR | Status: DC | PRN
  Administered 2014-02-27 (×2): 50 ug via INTRAVENOUS
  Administered 2014-02-27: 100 ug via INTRAVENOUS

## 2014-02-27 MED ORDER — MORPHINE SULFATE 2 MG/ML IJ SOLN
1.0000 mg | INTRAMUSCULAR | Status: DC | PRN
  Administered 2014-02-27 – 2014-02-28 (×3): 1 mg via INTRAVENOUS
  Filled 2014-02-27 (×3): qty 1

## 2014-02-27 MED ORDER — ACETAMINOPHEN 325 MG PO TABS
650.0000 mg | ORAL_TABLET | Freq: Four times a day (QID) | ORAL | Status: DC | PRN
  Filled 2014-02-27: qty 2

## 2014-02-27 MED ORDER — MICROFIBRILLAR COLL HEMOSTAT EX PADS
MEDICATED_PAD | CUTANEOUS | Status: DC | PRN
  Administered 2014-02-27: 1 via TOPICAL

## 2014-02-27 MED ORDER — PHENYLEPHRINE HCL 10 MG/ML IJ SOLN
INTRAMUSCULAR | Status: DC | PRN
  Administered 2014-02-27: 40 ug via INTRAVENOUS

## 2014-02-27 MED ORDER — POLYETHYLENE GLYCOL 3350 17 G PO PACK: 17.0000 g | PACK | Freq: Every day | ORAL | Status: DC | PRN

## 2014-02-27 MED ORDER — GLYCOPYRROLATE 0.2 MG/ML IJ SOLN
INTRAMUSCULAR | Status: DC | PRN
  Administered 2014-02-27: 0.6 mg via INTRAVENOUS

## 2014-02-27 MED ORDER — HYDROMORPHONE HCL PF 1 MG/ML IJ SOLN
0.5000 mg | INTRAMUSCULAR | Status: DC | PRN
Start: 2014-02-27 — End: 2014-02-27

## 2014-02-27 MED ORDER — RISAQUAD PO CAPS
1.0000 | ORAL_CAPSULE | Freq: Every day | ORAL | Status: DC
  Administered 2014-02-28: 1 via ORAL
  Filled 2014-02-27 (×2): qty 1

## 2014-02-27 SURGICAL SUPPLY — 70 items
ASSEMBLY NECK TAPER FIXED 135 (Orthopedic Implant) ×3 IMPLANT
BIT DRILL 5/64X5 DISP (BIT) IMPLANT
BLADE SAW SAG 73X25 THK (BLADE) ×2
BLADE SAW SGTL 73X25 THK (BLADE) ×1 IMPLANT
BLADE SURG 15 STRL LF DISP TIS (BLADE) ×1 IMPLANT
BLADE SURG 15 STRL SS (BLADE) ×2
CEMENT BONE DEPUY (Cement) ×3 IMPLANT
CHLORAPREP W/TINT 26ML (MISCELLANEOUS) ×3 IMPLANT
CLOSURE WOUND 1/2 X4 (GAUZE/BANDAGES/DRESSINGS) ×1
COVER SURGICAL LIGHT HANDLE (MISCELLANEOUS) ×3 IMPLANT
DRAPE INCISE IOBAN 66X45 STRL (DRAPES) ×6 IMPLANT
DRAPE SURG 17X23 STRL (DRAPES) ×3 IMPLANT
DRAPE U-SHAPE 47X51 STRL (DRAPES) ×3 IMPLANT
DRILL BIT 5/64 (BIT) IMPLANT
DRSG MEPILEX BORDER 4X8 (GAUZE/BANDAGES/DRESSINGS) ×3 IMPLANT
ELECT BLADE 4.0 EZ CLEAN MEGAD (MISCELLANEOUS) ×3
ELECT REM PT RETURN 9FT ADLT (ELECTROSURGICAL) ×3
ELECTRODE BLDE 4.0 EZ CLN MEGD (MISCELLANEOUS) ×1 IMPLANT
ELECTRODE REM PT RTRN 9FT ADLT (ELECTROSURGICAL) ×1 IMPLANT
EVACUATOR 1/8 PVC DRAIN (DRAIN) IMPLANT
GLENOID ANCHOR PEG CROSSLK 44 (Orthopedic Implant) ×3 IMPLANT
GLOVE BIO SURGEON STRL SZ7 (GLOVE) ×3 IMPLANT
GLOVE BIO SURGEON STRL SZ7.5 (GLOVE) ×3 IMPLANT
GLOVE BIOGEL PI IND STRL 7.0 (GLOVE) ×1 IMPLANT
GLOVE BIOGEL PI IND STRL 8 (GLOVE) ×1 IMPLANT
GLOVE BIOGEL PI INDICATOR 7.0 (GLOVE) ×2
GLOVE BIOGEL PI INDICATOR 8 (GLOVE) ×2
GOWN STRL REUS W/ TWL LRG LVL3 (GOWN DISPOSABLE) ×1 IMPLANT
GOWN STRL REUS W/ TWL XL LVL3 (GOWN DISPOSABLE) ×1 IMPLANT
GOWN STRL REUS W/TWL LRG LVL3 (GOWN DISPOSABLE) ×2
GOWN STRL REUS W/TWL XL LVL3 (GOWN DISPOSABLE) ×2
HANDPIECE INTERPULSE COAX TIP (DISPOSABLE) ×2
HEAD HUMERAL ECC 44X18MM SHLDR (Head) ×3 IMPLANT
HEMOSTAT SURGICEL 2X14 (HEMOSTASIS) ×3 IMPLANT
HOOD PEEL AWAY FACE SHEILD DIS (HOOD) ×6 IMPLANT
KIT BASIN OR (CUSTOM PROCEDURE TRAY) ×3 IMPLANT
KIT ROOM TURNOVER OR (KITS) ×3 IMPLANT
MANIFOLD NEPTUNE II (INSTRUMENTS) ×3 IMPLANT
NEEDLE HYPO 25GX1X1/2 BEV (NEEDLE) IMPLANT
NEEDLE MAYO TROCAR (NEEDLE) ×3 IMPLANT
NS IRRIG 1000ML POUR BTL (IV SOLUTION) ×3 IMPLANT
PACK SHOULDER (CUSTOM PROCEDURE TRAY) ×3 IMPLANT
PAD ARMBOARD 7.5X6 YLW CONV (MISCELLANEOUS) ×6 IMPLANT
PIN METAGLENE 2.5 (PIN) ×3 IMPLANT
RETRIEVER SUT HEWSON (MISCELLANEOUS) IMPLANT
SET HNDPC FAN SPRY TIP SCT (DISPOSABLE) ×1 IMPLANT
SLING ARM IMMOBILIZER LRG (SOFTGOODS) ×3 IMPLANT
SLING ARM IMMOBILIZER MED (SOFTGOODS) IMPLANT
SMARTMIX MINI TOWER (MISCELLANEOUS) ×6
SPONGE LAP 18X18 X RAY DECT (DISPOSABLE) ×3 IMPLANT
SPONGE LAP 4X18 X RAY DECT (DISPOSABLE) IMPLANT
STEM AP PC10 (Stem) ×3 IMPLANT
STRIP CLOSURE SKIN 1/2X4 (GAUZE/BANDAGES/DRESSINGS) ×2 IMPLANT
SUCTION FRAZIER TIP 10 FR DISP (SUCTIONS) ×3 IMPLANT
SUPPORT WRAP ARM LG (MISCELLANEOUS) ×3 IMPLANT
SUT ETHIBOND NAB CT1 #1 30IN (SUTURE) ×6 IMPLANT
SUT FIBERWIRE #2 38 T-5 BLUE (SUTURE) ×3
SUT MNCRL AB 4-0 PS2 18 (SUTURE) ×3 IMPLANT
SUT SILK 2 0 TIES 17X18 (SUTURE)
SUT SILK 2-0 18XBRD TIE BLK (SUTURE) IMPLANT
SUT VIC AB 0 CTB1 27 (SUTURE) IMPLANT
SUT VIC AB 2-0 CT1 27 (SUTURE) ×2
SUT VIC AB 2-0 CT1 TAPERPNT 27 (SUTURE) ×1 IMPLANT
SUTURE FIBERWR #2 38 T-5 BLUE (SUTURE) ×1 IMPLANT
SYR CONTROL 10ML LL (SYRINGE) IMPLANT
TAPE FIBER 2MM 7IN #2 BLUE (SUTURE) ×9 IMPLANT
TOWEL OR 17X24 6PK STRL BLUE (TOWEL DISPOSABLE) ×3 IMPLANT
TOWEL OR 17X26 10 PK STRL BLUE (TOWEL DISPOSABLE) ×3 IMPLANT
TOWER SMARTMIX MINI (MISCELLANEOUS) ×2 IMPLANT
WATER STERILE IRR 1000ML POUR (IV SOLUTION) ×3 IMPLANT

## 2014-02-27 NOTE — Anesthesia Preprocedure Evaluation (Addendum)
Anesthesia Evaluation  Patient identified by MRN, date of birth, ID band Patient awake    Reviewed: Allergy & Precautions, H&P , NPO status , Patient's Chart, lab work & pertinent test results  History of Anesthesia Complications Negative for: history of anesthetic complications  Airway Mallampati: I TM Distance: >3 FB Neck ROM: Full    Dental  (+) Teeth Intact, Dental Advisory Given   Pulmonary former smoker,  Hx of Lung ca. No CPAP.  Occasional Home O2 2 L.    Pulmonary exam normal       Cardiovascular negative cardio ROS      Neuro/Psych negative neurological ROS  negative psych ROS   GI/Hepatic Neg liver ROS, GERD-  Controlled,  Endo/Other  negative endocrine ROS  Renal/GU negative Renal ROS     Musculoskeletal   Abdominal   Peds  Hematology   Anesthesia Other Findings   Reproductive/Obstetrics                       Anesthesia Physical Anesthesia Plan  ASA: III  Anesthesia Plan: General   Post-op Pain Management:    Induction: Intravenous  Airway Management Planned: Oral ETT  Additional Equipment:   Intra-op Plan:   Post-operative Plan: Extubation in OR  Informed Consent: I have reviewed the patients History and Physical, chart, labs and discussed the procedure including the risks, benefits and alternatives for the proposed anesthesia with the patient or authorized representative who has indicated his/her understanding and acceptance.   Dental advisory given  Plan Discussed with: CRNA, Anesthesiologist and Surgeon  Anesthesia Plan Comments:        Anesthesia Quick Evaluation

## 2014-02-27 NOTE — Progress Notes (Signed)
Utilization review completed.  

## 2014-02-27 NOTE — Op Note (Signed)
Procedure(s): LEFT TOTAL SHOULDER ARTHROPLASTY Procedure Note  Lisa Sherman female 73 y.o. 02/27/2014  Procedure(s) and Anesthesia Type:    * LEFT TOTAL SHOULDER ARTHROPLASTY - General  Surgeon(s) and Role:    * Nita Sells, MD - Primary   Indications:  73 y.o. female  With endstage left shoulder arthritis. Pain and dysfunction interfered with quality of life and nonoperative treatment with activity modification, NSAIDS and injections failed.     Surgeon: Nita Sells   Assistants: Jeanmarie Hubert PA-C [facility] was present and scrubbed throughout the procedure and was essential in positioning, retraction, exposure, and closure)  Anesthesia: General endotracheal anesthesia with preoperative interscalene block    Procedure Detail  LEFT TOTAL SHOULDER ARTHROPLASTY  Findings: DePuy size 10 press-fit stem, a 44 x 18 eccentric head, 44 glenoid. The subscapularis was taken down in one layer and repaired at the conclusion of the procedure. She had severe glenoid wear. The central peg and inferior posterior peg penetrated the glenoid vault.  Estimated Blood Loss:  200 mL         Drains: 1 medium hemovac  Blood Given: none          Specimens: none        Complications:  * No complications entered in OR log *         Disposition: PACU - hemodynamically stable.         Condition: stable    Procedure:   The patient was identified in the preoperative holding area where I personally marked the operative extremity after verifying with the patient and consent. She  was taken to the operating room where She was transferred to the   operative table.  The patient received an interscalene block in   the holding area by the attending anesthesiologist.  General anesthesia was induced   in the operating room without complication.  The patient did receive IV  Ancef prior to the commencement of the procedure.  The patient was   placed in the beach-chair position  with the back raised about 30   degrees.  The nonoperative extremity and head and neck were carefully   positioned and padded protecting against neurovascular compromise.  The   left upper extremity was then prepped and draped in the standard sterile   fashion.    The appropriate operative time-out was performed with   Anesthesia, the perioperative staff, as well as myself and we all agreed   that the left side was the correct operative site.  An approximately   10 cm incision was made from the tip of the coracoid to the center point of the   humerus at the level of the axilla.  Dissection was carried down sharply   through subcutaneous tissues and cephalic vein was identified and taken   laterally with the deltoid.  The pectoralis major was taken medially.  The   upper 1 cm of the pectoralis major was released from its attachment on   the humerus.  The clavipectoral fascia was incised just lateral to the   conjoined tendon.  This incision was carried up to but not into the   coracoacromial ligament.  Digital palpation was used to prove   integrity of the axillary nerve which was protected throughout the   procedure.  Musculocutaneous nerve was not palpated in the operative   field.  Conjoined tendon was then retracted gently medially and the   deltoid laterally.  Anterior circumflex humeral vessels were clamped and  coagulated.  The soft tissues overlying the biceps was incised and this   incision was carried across the transverse humeral ligament to the base   of the coracoid.  The biceps was tenodesed to the soft tissue just above   pectoralis major and the remaining portion of the biceps superiorly was   excised.  The subscapularis was taken down a single layer with the capsule off of the lesser tuberosity and the capsule was released all the way down to the 6 o'clock position of the humeral head.   The humeral head was then delivered with simultaneous adduction,   extension and  external rotation.  Humeral head was noted to be severely degenerated with no cartilage. There is some inflammatory appearing tissue in certain areas covering the head.  All humeral osteophytes were removed   and the anatomic neck of the humerus was marked and cut free hand at   approximately 25 degrees retroversion within about 3 mm of the cuff   reflection posteriorly.  The head size was estimated to be a 44 medium   offset.  At that point, the humeral head was retracted posteriorly with   a Fukuda retractor and the anterior-inferior capsule was excised.   Remaining portion of the capsule was released at the base of the   coracoid.  The remaining biceps anchor and the entire anterior-inferior   labrum was excised.  The posterior labrum was absent. She had severe medialization the glenoid and slight retroversion. There was hypertrophic inflammatory tissue covering the glenoid. This was scraped down with a Cobb elevator. The guidepin was placed bicortically with +0 elevated guide.  The reamer was used to ream to concentric bone with punctate bleeding, very minimal reaming was used..  This gave an excellent concentric surface.  The center hole was then drilled for an anchor peg glenoid followed by the three peripheral holes and the Center hole and the posterior inferior hole exited the glenoid wall.  I then pulse irrigated these holes and dried   them with Surgicel.  The three peripheral holes , with the exception of the posterior inferior, were then   pressurized cemented and the anchor peg glenoid was placed and impacted   with an excellent fit.  The glenoid was a 44 component.  The proximal humerus was then again exposed taking care not to displace the glenoid.    The humerus was then sequentially reamed going from 6 to 10 by 2 mm incriments. The 10 mm reamer was found to have appropriate cortical contact.  A   box osteotome was then used and a 10-mm broach.  The broach handle was   removed and the  trial head was placed.   Calcar reamer was used.The eccentric 44 x 18 head fit best.  With the trial implantation of the component, there was   approximately 50% posterior translation with immediate snap back to the   anatomic position.  With forward elevation, there was no tendency   towards posterior subluxation.   The trial was removed and the final implant was prepared on a back table.  The trial was removed and the final implant was prepared on a back table.   Small holes were drilled on both sides of the lesser tuberosity , through which 3 Fibertapes were passed. The implant was then placed through the loop of all 3 Fibertapes and impacted with an excellent press-fit. This achieved excellent anatomic reconstruction of the proximal humerus.  The joint was then copiously irrigated  with pulse lavage.  The subscapularis was then repaired using the 3 Fibertapes previously passed.   One #1 Ethibond was placed at the rotator interval just above   the lesser tuberosity. After repair of the lesser tuberosity, a medium  . Again copious irrigation was   used.   Skin was closed with 2-0 Vicryl sutures in the deep dermal layer and 4-0 Monocryl in a subcuticular  running fashion.  Sterile dressings were then applied including Steri- Strips, 4x4s, ABDs and tape.  The patient was placed in a sling and allowed to awaken from general anesthesia and taken to the recovery room in stable  condition.      POSTOPERATIVE PLAN:  Early passive range of motion will be allowed with the goal of 40 degrees external rotation and 140 degrees forward elevation.  No internal rotation at this time.  No active motion of the arm until the lesser tuberosity heals.  The patient will likely be kept in the hospital for 1-2 days and then discharged home.

## 2014-02-27 NOTE — Progress Notes (Signed)
Post-op VS taken on arrival to [place]

## 2014-02-27 NOTE — Transfer of Care (Signed)
Immediate Anesthesia Transfer of Care Note  Patient: Lisa Sherman  Procedure(s) Performed: Procedure(s) with comments: LEFT TOTAL SHOULDER ARTHROPLASTY (Left) - Left total shoulder arthroplasty  Patient Location: PACU  Anesthesia Type:General  Level of Consciousness: awake and sedated  Airway & Oxygen Therapy: Patient Spontanous Breathing and Patient connected to nasal cannula oxygen  Post-op Assessment: Report given to PACU RN, Post -op Vital signs reviewed and stable and Patient moving all extremities  Post vital signs: Reviewed and stable  Complications: No apparent anesthesia complications

## 2014-02-27 NOTE — Progress Notes (Signed)
PHARMACIST - PHYSICIAN ORDER COMMUNICATION  CONCERNING: P&T Medication Policy on Herbal Medications  DESCRIPTION:  This patient's order for:  Lisa Sherman Sensitive  has been noted.  This product(s) is classified as an "herbal" or natural product. Due to a lack of definitive safety studies or FDA approval, nonstandard manufacturing practices, plus the potential risk of unknown drug-drug interactions while on inpatient medications, the Pharmacy and Therapeutics Committee does not permit the use of "herbal" or natural products of this type within [facility].   ACTION TAKEN: The pharmacy department is unable to verify this order at this time. Please reevaluate patient's clinical condition at discharge and address if the herbal or natural product(s) should be resumed at that time.  Note: this product contains marshmallow and slippery elm combined with vegetarian digestive enzymes, licorice root and gamma-oryzanol.  The digestive enzyme formulary product we have is Creon. If this is needed, please reorder that product.  Thank you.

## 2014-02-27 NOTE — Anesthesia Procedure Notes (Addendum)
Anesthesia Regional Block:  Interscalene brachial plexus block  Pre-Anesthetic Checklist: ,, timeout performed, Correct Patient, Correct Site, Correct Laterality, Correct Procedure, Correct Position, site marked, Risks and benefits discussed,  Surgical consent,  Pre-op evaluation,  At surgeon's request and post-op pain management  Laterality: Left  Prep: chloraprep       Needles:  Injection technique: Single-shot  Needle Type: Echogenic Stimulator Needle     Needle Length: 5cm 5 cm Needle Gauge: 22 and 22 G    Additional Needles:  Procedures: ultrasound guided (picture in chart) and nerve stimulator Interscalene brachial plexus block  Nerve Stimulator or Paresthesia:  Response: bicep contraction, 0.45 mA,   Additional Responses:   Narrative:  Start time: 02/27/2014 7:07 AM End time: 02/27/2014 7:17 AM Injection made incrementally with aspirations every 5 mL.  Performed by: Personally  Anesthesiologist: J. Tamela Gammon, MD  Additional Notes: Functioning IV was confirmed and monitors applied.  A 37mm 22ga echogenic arrow stimulator was used. Sterile prep and drape,hand hygiene and sterile gloves were used.Ultrasound guidance: relevant anatomy identified, needle position confirmed, local anesthetic spread visualized around nerve(s)., vascular puncture avoided.  Image printed for medical record.  Negative aspiration and negative test dose prior to incremental administration of local anesthetic. The patient tolerated the procedure well.   Procedure Name: Intubation Date/Time: 02/27/2014 7:40 AM Performed by: Terrill Mohr Pre-anesthesia Checklist: Patient identified, Emergency Drugs available, Suction available and Patient being monitored Patient Re-evaluated:Patient Re-evaluated prior to inductionOxygen Delivery Method: Circle system utilized Preoxygenation: Pre-oxygenation with 100% oxygen Intubation Type: IV induction Ventilation: Mask ventilation without  difficulty Laryngoscope Size: Mac and 4 Grade View: Grade II Tube type: Oral Tube size: 7.5 mm Number of attempts: 1 Airway Equipment and Method: Stylet Placement Confirmation: ETT inserted through vocal cords under direct vision,  breath sounds checked- equal and bilateral and positive ETCO2 Secured at: 21 (cm at teeth) cm Tube secured with: Tape Dental Injury: Teeth and Oropharynx as per pre-operative assessment

## 2014-02-27 NOTE — Anesthesia Postprocedure Evaluation (Signed)
Anesthesia Post Note  Patient: Lisa Sherman  Procedure(s) Performed: Procedure(s) (LRB): LEFT TOTAL SHOULDER ARTHROPLASTY (Left)  Anesthesia type: general  Patient location: PACU  Post pain: Pain level controlled  Post assessment: Patient's Cardiovascular Status Stable  Last Vitals:  Filed Vitals:   02/27/14 1030  BP: 127/72  Pulse: 65  Temp:   Resp: 13    Post vital signs: Reviewed and stable  Level of consciousness: sedated  Complications: No apparent anesthesia complications

## 2014-02-27 NOTE — Discharge Instructions (Signed)
Discharge Instructions after Total Shoulder Arthroplasty ° ° °A sling has been provided for you. Remove the sling 5 times each day to perform motion exercises. After the first 48 to 72 hours, discontinue using the sling. You should use the sling as a protective device, if you are in a crowd.  °Use ice on the shoulder intermittently over the first 48 hours after surgery.  °Pain medication has been prescribed for you.  °Use your medication liberally over the first 48 hours, and then begin to taper your use. You may take Extra Strength Tylenol or Tylenol only in place of the pain pills. DO NOT take ANY nonsteroidal anti-inflammatory pain medications: Advil, Motrin, Ibuprofen, Aleve, Naproxen, or Naprosyn. °Take one aspirin a day for 2 weeks after surgery, unless you have an aspirin sensitivity/allergy or asthma. °You may remove your dressing after two days.  °You may shower 5 days after surgery. The incision CANNOT get wet prior to 5 days. Simply allow the water to wash over the site and then pat dry. Do not rub the incision. Make sure your axilla (armpit) is completely dry after showering.  °Active reaching and lifting are not permitted. You may use the operative arm for activities of daily living that do not require the operative arm to leave the side of the body, such as eating, drinking, bathing, etc.  °Three to 5 times each day you should perform assisted overhead reaching and external rotation (outward turning) exercises with the operative arm. You were taught these exercises prior to discharge. Both exercises should be done with the non-operative arm used as the "therapist arm" while the operative arm remains relaxed. Ten of each exercise should be done three to five times each day. ° ° °Overhead reach is helping to lift your stiff arm up as high as it will go. To stretch your overhead reach, lie flat on your back, relax, and grasp the wrist of the tight shoulder with your opposite hand. Using the power in your  opposite arm, bring the stiff arm up as far as it is comfortable. Start holding it for ten seconds and then work up to where you can hold it for a count of 30. Breathe slowly and deeply while the arm is moved. Repeat this stretch ten times, trying to help the ar up a little higher each time.  ° ° ° °External rotation is turning the arm out to the side while your elbow stays close to your body. External rotation is best stretched while you are lying on your back. Hold a cane, yardstick, broom handle, or dowel in both hands. Bend both elbows to a right angle. Use steady, gentle force from your normal arm to rotate the hand of the stiff shoulder out away from your body. Continue the rotation as far as it will go comfortably, holding it there for a count of 10. Repeat this exercise ten times.  ° ° ° ° °Please call [phone] during normal business hours or [phone] after hours for any problems. Including the following: ° °- excessive redness of the incisions °- drainage for more than 4 days °- fever of more than 101.5 F ° °*Please note that pain medications will not be refilled after hours or on weekends. ° ° ° °

## 2014-02-27 NOTE — H&P (Signed)
Lisa Sherman is an 73 y.o. female.   Chief Complaint: L shoulder pain and dysfunction HPI: Severe L shoulder endstage OA, interferes with quality of life and sleep.  Failed conservative management.  Past Medical History  Diagnosis Date  . Cancer     hx of lung cancer - surgery - chemo - radiation- all completed july 2009  . Pneumonia     yrs ago  . GERD (gastroesophageal reflux disease)   . Arthritis     severe oa right hip; also has arthritis both shoulders-limited rom.  . Complication of anesthesia     slow getting anesthesia out of system  . Arthritis     Past Surgical History  Procedure Laterality Date  . Removal upper right lobe lung for cancer -2009 - surgery at [facility]    . 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 Abdominal hysterectomy  1996  . Total hip arthroplasty Right 10/11/2013    Procedure: RIGHT TOTAL HIP ARTHROPLASTY ANTERIOR APPROACH;  Surgeon: Mcarthur Rossetti, MD;  Location: [facility];  Service: Orthopedics;  Laterality: Right;  . Tonsillectomy    . Eye surgery Bilateral     cataracts with lens  . Colonoscopy w/ biopsies and polypectomy    . Total shoulder arthroplasty Right 11/28/2013    Procedure: TOTAL SHOULDER ARTHROPLASTY;  Surgeon: Nita Sells, MD;  Location: [facility];  Service: Orthopedics;  Laterality: Right;  . Lobectomy    . Abdominal hysterectomy    . Joint replacement Right     shoulder  . Joint replacement Right     hip  . Colonoscopy      History reviewed. No pertinent family history. Social History:  reports that she has quit smoking. She does not have any smokeless tobacco history on file. She reports that she drinks alcohol. She reports that she does not use illicit drugs.  Allergies: No Known Allergies  Medications Prior to Admission  Medication Sig Dispense Refill  . acidophilus (RISAQUAD) CAPS capsule Take 1 capsule by mouth daily.      . cabergoline (DOSTINEX) 0.5 MG tablet Take 0.5  mg by mouth 2 (two) times a week. Monday, and fridays      . cabergoline (DOSTINEX) 0.5 MG tablet Take 0.5 mg by mouth 2 (two) times a week. Mondays and fridays      . celecoxib (CELEBREX) 100 MG capsule Take 100 mg by mouth daily.      . D-Mannose POWD Take 5 mLs by mouth 2 (two) times daily.      . Digestive Enzymes (TYLER SIMILASE SENSITIVE) CAPS Take 1 capsule by mouth 3 (three) times daily.      Marland Kitchen HYDROmorphone (DILAUDID) 2 MG tablet Take 1-2 pills every 4 hours as needed for pain  60 tablet  0  . HYDROmorphone (DILAUDID) 2 MG tablet Take 2 mg by mouth every 4 (four) hours as needed for severe pain.      Marland Kitchen HYDROmorphone (DILAUDID) 4 MG tablet Take 4 mg by mouth at bedtime.       Marland Kitchen HYDROmorphone HCl (EXALGO) 8 MG T24A SR tablet Take 8 mg by mouth at bedtime.      Marland Kitchen MAGNESIUM CITRATE PO Take 270 mg by mouth 2 (two) times daily. 2 capsules      . MAGNESIUM CITRATE PO Take 2 tablets by mouth 2 (two) times daily.      . niacin 100 MG tablet Take 100 mg  by mouth daily.      Marland Kitchen OVER THE COUNTER MEDICATION Take 2 tablets by mouth 2 (two) times daily. Metaboliczyme      . OVER THE COUNTER MEDICATION Apply 1 application topically daily. Pregnenolone Cream      . OVER THE COUNTER MEDICATION Take 1 tablet by mouth daily. ThioDox      . OVER THE COUNTER MEDICATION Take 10 drops by mouth daily. Vitamin D/K12      . OVER THE COUNTER MEDICATION Take 5 mLs by mouth daily. MCT oil      . OVER THE COUNTER MEDICATION Take 1 capsule by mouth daily. Prescript - assist probiotic      . OVER THE COUNTER MEDICATION Take 1 capsule by mouth 2 (two) times daily. Methylation Support Formula      . OVER THE COUNTER MEDICATION Take 1 capsule by mouth 2 (two) times daily. Biost      . OVER THE COUNTER MEDICATION Take 15 drops by mouth 2 (two) times daily. Apo-Dolor      . OVER THE COUNTER MEDICATION Take 15 drops by mouth 2 (two) times daily. Itires      . OVER THE COUNTER MEDICATION Take 2 tablets by mouth 3 (three)  times daily. Supplement "cholacol"      . OVER THE COUNTER MEDICATION Take 2 tablets by mouth daily. Supplement "Poly Resvetartrol"      . OVER THE COUNTER MEDICATION Take 1 tablet by mouth daily. Supplement "CandiBactin"      . OVER THE COUNTER MEDICATION Take 1 tablet by mouth 2 (two) times daily. Supplement " methyl-Guard plus"      . OVER THE COUNTER MEDICATION Take 2 tablets by mouth 2 (two) times daily. Supplement "Meriva-500"      . OVER THE COUNTER MEDICATION Take 2 tablets by mouth 2 (two) times daily. Supplement "Ultra minerals"      . OVER THE COUNTER MEDICATION Take 3 tablets by mouth 2 (two) times daily. Supplement "Ligaplex"      . OVER THE COUNTER MEDICATION Take 2 tablets by mouth daily. Supplement "Cellular Vitality"      . OVER THE COUNTER MEDICATION Take 1 tablet by mouth daily. Supplement "Kavinace"      . Zinc 15 MG CAPS Take 15 mg by mouth 2 (two) times daily.        No results found for this or any previous visit (from the past 48 hour(s)). No results found.  Review of Systems  All other systems reviewed and are negative.   Blood pressure 129/84, pulse 59, temperature 98.1 F (36.7 C), temperature source Oral, resp. rate 18, height 5\' 6"  (1.676 m), weight 66.225 kg (146 lb), SpO2 98.00%. Physical Exam  Constitutional: She is oriented to person, place, and time. She appears well-developed and well-nourished.  HENT:  Head: Atraumatic.  Eyes: EOM are normal.  Cardiovascular: Intact distal pulses.   Respiratory: Effort normal.  Musculoskeletal:  L shoulder severe pain with ROM. NVID.  Neurological: She is alert and oriented to person, place, and time.  Skin: Skin is warm and dry.  Psychiatric: She has a normal mood and affect.     Assessment/Plan Severe L shoulder endstage OA Plan L TSA Risks / benefits of surgery discussed Consent on chart  NPO for OR Preop antibiotics   Nita Sells 02/27/2014, 7:14 AM

## 2014-02-28 LAB — BASIC METABOLIC PANEL
BUN: 10 mg/dL (ref 6–23)
CHLORIDE: 104 meq/L (ref 96–112)
CO2: 23 meq/L (ref 19–32)
Calcium: 8.4 mg/dL (ref 8.4–10.5)
Creatinine, Ser: 0.62 mg/dL (ref 0.50–1.10)
GFR calc Af Amer: >90 mL/min (ref >90)
GFR calc non Af Amer: 86 mL/min — ABNORMAL LOW (ref >90)
Glucose, Bld: 109 mg/dL — ABNORMAL HIGH (ref 70–99)
POTASSIUM: 4 meq/L (ref 3.7–5.3)
Sodium: 138 mEq/L (ref 137–147)

## 2014-02-28 LAB — CBC
HCT: 31 % — ABNORMAL LOW (ref 36.0–46.0)
Hemoglobin: 10 g/dL — ABNORMAL LOW (ref 12.0–15.0)
MCH: 31.4 pg (ref 26.0–34.0)
MCHC: 32.3 g/dL (ref 30.0–36.0)
MCV: 97.5 fL (ref 78.0–100.0)
Platelets: 159 10*3/uL (ref 150–400)
RBC: 3.18 MIL/uL — ABNORMAL LOW (ref 3.87–5.11)
RDW: 16.7 % — ABNORMAL HIGH (ref 11.5–15.5)
WBC: 9.9 10*3/uL (ref 4.0–10.5)

## 2014-02-28 MED ORDER — HYDROMORPHONE HCL ER 8 MG PO T24A: 8.0000 mg | EXTENDED_RELEASE_TABLET | Freq: Every day | ORAL | Status: AC

## 2014-02-28 MED ORDER — KETOROLAC TROMETHAMINE 30 MG/ML IJ SOLN
30.0000 mg | Freq: Four times a day (QID) | INTRAMUSCULAR | Status: DC | PRN
  Administered 2014-02-28: 30 mg via INTRAVENOUS
  Filled 2014-02-28: qty 1

## 2014-02-28 NOTE — Evaluation (Signed)
Occupational Therapy Evaluation and Discharge Patient Details Name: Lisa Sherman MRN: [id number] DOB: Jan 11, 1941 Today's Date: 02/28/2014    History of Present Illness LTSA   Clinical Impression   This 73 yo female admitted and underwent above (RTSA 3 months ago) presents to acute OT with all education completed. Follow up and progress per MD. Acute OT will sign off.    Follow Up Recommendations  No OT follow up    Equipment Recommendations  None recommended by OT       Precautions / Restrictions Precautions Precautions: Shoulder Shoulder Interventions: Shoulder sling/immobilizer;Off for dressing/bathing/exercises Restrictions Weight Bearing Restrictions: Yes LUE Weight Bearing: Non weight bearing      Mobility Bed Mobility Overal bed mobility: Needs Assistance Bed Mobility: Supine to Sit     Supine to sit: Min guard        Transfers Overall transfer level: Needs assistance   Transfers: Sit to/from Stand Sit to Stand: Min guard         General transfer comment: No DME    Balance Overall balance assessment: Needs assistance Sitting-balance support: Single extremity supported;Feet supported Sitting balance-Leahy Scale: Good     Standing balance support: Single extremity supported Standing balance-Leahy Scale: Fair                                              Pertinent Vitals/Pain No c/o pain     Hand Dominance Right   Extremity/Trunk Assessment Upper Extremity Assessment Upper Extremity Assessment: LUE deficits/detail LUE Deficits / Details: shoulder sx this admission; distal WNL LUE Coordination: decreased gross motor           Communication Communication Communication: No difficulties   Cognition Arousal/Alertness: Awake/alert Behavior During Therapy: WFL for tasks assessed/performed Overall Cognitive Status: Within Functional Limits for tasks assessed                           Shoulder Instructions  Shoulder Instructions Donning/doffing shirt without moving shoulder: Caregiver independent with task Method for sponge bathing under operated UE: Caregiver independent with task Donning/doffing sling/immobilizer: Caregiver independent with task Correct positioning of sling/immobilizer: Independent Pendulum exercises (written home exercise program):  (NA) ROM for elbow, wrist and digits of operated UE: Independent Sling wearing schedule (on at all times/off for ADL's): Independent Proper positioning of operated UE when showering: Independent Dressing change:  (NA) Positioning of UE while sleeping: Caregiver independent with task    Home Living Family/patient expects to be discharged to:: Private residence Living Arrangements: Non-relatives/Friends Available Help at Discharge: Personal care attendant;Available 24 hours/day Type of Home: House Home Access: Stairs to enter [employer] of Steps: 9 Entrance Stairs-Rails: Right Home Layout: Able to live on main level with bedroom/bathroom     Bathroom Shower/Tub: Tub/shower unit;Walk-in shower   Bathroom Toilet: Handicapped height     Home Equipment: Toilet riser;Shower seat          Prior Functioning/Environment Level of Independence: Needs assistance  Gait / Transfers Assistance Needed: RW ADL's / Homemaking Assistance Needed: Bil bad shoulder so caregivers A her prn   Comments: Had personal care attendants prior to surgery             OT Goals(Current goals can be found in the care plan section) Acute Rehab OT Goals Patient Stated Goal: Home today OT Goal Formulation: With patient  OT Frequency:               End of Session Equipment Utilized During Treatment:  (sling LUE) Nurse Communication:  (Pt ready to go from OT standpoint)  Activity Tolerance: Patient tolerated treatment well Patient left: with family/visitor present;with nursing/sitter in room (sitting EOB )   Time: 7078-6754 OT Time  Calculation (min): 36 min Charges:  OT General Charges $OT Visit: 1 Procedure OT Evaluation $Initial OT Evaluation Tier I: 1 Procedure OT Treatments $Self Care/Home Management : 8-22 mins $Therapeutic Exercise: 8-22 mins  Almon Register [phone] 02/28/2014, 12:01 PM

## 2014-02-28 NOTE — Discharge Summary (Signed)
Patient ID: Lisa Sherman MRN: [id number] DOB/AGE: 1941/01/13 73 y.o.  Admit date: 02/27/2014 Discharge date: 02/28/2014  Admission Diagnoses:  Active Problems:   Glenohumeral arthritis   Discharge Diagnoses:  Same  Past Medical History  Diagnosis Date  . Cancer     hx of lung cancer - surgery - chemo - radiation- all completed july 2009  . Pneumonia     yrs ago  . GERD (gastroesophageal reflux disease)   . Arthritis     severe oa right hip; also has arthritis both shoulders-limited rom.  . Complication of anesthesia     slow getting anesthesia out of system  . Arthritis     Surgeries: Procedure(s): LEFT TOTAL SHOULDER ARTHROPLASTY on 02/27/2014   Consultants:    Discharged Condition: Improved  Hospital Course: Shunna Mikaelian is an 73 y.o. female who was admitted 02/27/2014 for operative treatment of left shoulder end-stage arthritis. Patient has severe unremitting pain that affects sleep, daily activities, and work/hobbies. After pre-op clearance the patient was taken to the operating room on 02/27/2014 and underwent  Procedure(s): LEFT TOTAL SHOULDER ARTHROPLASTY.    Patient was given perioperative antibiotics: Anti-infectives   Start     Dose/Rate Route Frequency Ordered Stop   02/27/14 1200  ceFAZolin (ANCEF) IVPB 1 g/50 mL premix     1 g 100 mL/hr over 30 Minutes Intravenous Every 6 hours 02/27/14 1145 02/27/14 2355   02/27/14 0628  ceFAZolin (ANCEF) 2-3 GM-% IVPB SOLR    Comments:  Nyoka Cowden   : cabinet override      02/27/14 0628 02/27/14 1844   02/27/14 0600  ceFAZolin (ANCEF) IVPB 2 g/50 mL premix     2 g 100 mL/hr over 30 Minutes Intravenous On call to O.R. 02/26/14 1414 02/27/14 0745       Patient was given sequential compression devices, early ambulation, and ASA 325mg  BID to prevent DVT.  Patient benefited maximally from hospital stay and there were no complications.    Recent vital signs: Patient Vitals for the past 24 hrs:  BP Temp Temp src Pulse  Resp SpO2  02/28/14 0641 95/51 mmHg - - - - -  02/28/14 0550 85/48 mmHg 97.9 F (36.6 C) Oral 74 20 99 %  02/27/14 1955 108/64 mmHg 98.6 F (37 C) Oral 72 20 100 %  02/27/14 1320 103/57 mmHg 98 F (36.7 C) Oral 67 16 98 %     Recent laboratory studies:  Recent Labs  02/28/14 0720  WBC 9.9  HGB 10.0*  HCT 31.0*  PLT 159  NA 138  K 4.0  CL 104  CO2 23  BUN 10  CREATININE 0.62  GLUCOSE 109*  CALCIUM 8.4     Discharge Medications:     Medication List         acidophilus Caps capsule  Take 1 capsule by mouth daily.     cabergoline 0.5 MG tablet  Commonly known as:  DOSTINEX  Take 0.5 mg by mouth 2 (two) times a week. Monday, and fridays     cabergoline 0.5 MG tablet  Commonly known as:  DOSTINEX  Take 0.5 mg by mouth 2 (two) times a week. Mondays and fridays     celecoxib 100 MG capsule  Commonly known as:  CELEBREX  Take 100 mg by mouth daily.     D-Mannose Powd  Take 5 mLs by mouth 2 (two) times daily.     HYDROmorphone 4 MG tablet  Commonly known as:  DILAUDID  Take 4  mg by mouth at bedtime.     HYDROmorphone 2 MG tablet  Commonly known as:  DILAUDID  Take 2 mg by mouth every 4 (four) hours as needed for severe pain.     HYDROmorphone 2 MG tablet  Commonly known as:  DILAUDID  Take 1-2 pills every 4 hours as needed for pain     HYDROmorphone HCl 8 MG T24a SR tablet  Commonly known as:  EXALGO  Take 1 tablet (8 mg total) by mouth at bedtime.     MAGNESIUM CITRATE PO  Take 270 mg by mouth 2 (two) times daily. 2 capsules     MAGNESIUM CITRATE PO  Take 2 tablets by mouth 2 (two) times daily.     niacin 100 MG tablet  Take 100 mg by mouth daily.     OVER THE COUNTER MEDICATION  Take 2 tablets by mouth 2 (two) times daily. Metaboliczyme     OVER THE COUNTER MEDICATION  Apply 1 application topically daily. Pregnenolone Cream     OVER THE COUNTER MEDICATION  Take 1 tablet by mouth daily. ThioDox     OVER THE COUNTER MEDICATION  Take 10  drops by mouth daily. Vitamin D/K12     OVER THE COUNTER MEDICATION  Take 5 mLs by mouth daily. MCT oil     OVER THE COUNTER MEDICATION  Take 1 capsule by mouth daily. Prescript - assist probiotic     OVER THE COUNTER MEDICATION  Take 1 capsule by mouth 2 (two) times daily. Methylation Support Formula     OVER THE COUNTER MEDICATION  Take 1 capsule by mouth 2 (two) times daily. Biost     OVER THE COUNTER MEDICATION  Take 15 drops by mouth 2 (two) times daily. Apo-Dolor     OVER THE COUNTER MEDICATION  Take 15 drops by mouth 2 (two) times daily. Itires     OVER THE COUNTER MEDICATION  Take 2 tablets by mouth 3 (three) times daily. Supplement "cholacol"     OVER THE COUNTER MEDICATION  Take 2 tablets by mouth daily. Supplement "Poly Resvetartrol"     OVER THE COUNTER MEDICATION  Take 1 tablet by mouth daily. Supplement "CandiBactin"     OVER THE COUNTER MEDICATION  Take 1 tablet by mouth 2 (two) times daily. Supplement " methyl-Guard plus"     OVER THE COUNTER MEDICATION  Take 2 tablets by mouth 2 (two) times daily. Supplement "Meriva-500"     OVER THE COUNTER MEDICATION  Take 2 tablets by mouth 2 (two) times daily. Supplement "Ultra minerals"     OVER THE COUNTER MEDICATION  Take 3 tablets by mouth 2 (two) times daily. Supplement "Ligaplex"     OVER THE COUNTER MEDICATION  Take 2 tablets by mouth daily. Supplement "Cellular Vitality"     OVER THE COUNTER MEDICATION  Take 1 tablet by mouth daily. Supplement "Kavinace"     TYLER SIMILASE SENSITIVE Caps  Take 1 capsule by mouth 3 (three) times daily.     Zinc 15 MG Caps  Take 15 mg by mouth 2 (two) times daily.        Diagnostic Studies: Dg Chest 2 View  02/18/2014   CLINICAL DATA:  Preop.  EXAM: CHEST  2 VIEW  COMPARISON:  None.  FINDINGS: The heart size and mediastinal contours are within normal limits. Tenting of right hemidiaphragm is noted consistent with scarring. Status post right shoulder arthroplasty.  Multiple small nodules are noted in the left lung which may represent  metastatic disease No pneumothorax is noted. Right apical thickening is noted. Right peritracheal prominence is noted concerning for possible adenopathy or mass.  IMPRESSION: Right peritracheal prominence is noted concerning for possible adenopathy. Also noted are multiple nodules in the left lung concerning for metastatic disease. CT scan of the chest is recommended for further evaluation. These results will be called to the ordering clinician or representative by the Radiologist Assistant, and communication documented in the PACS or zVision Dashboard.   Electronically Signed   By: Sabino Dick M.D.   On: 02/18/2014 12:45   Dg Shoulder Left Port  02/27/2014   CLINICAL DATA:  Arthroplasty  EXAM: PORTABLE LEFT SHOULDER -1 VIEW  COMPARISON:  None.  FINDINGS: Patient is status post total shoulder replacement with prosthetic component appearing well-seated. No acute fracture or dislocation.  IMPRESSION: Prosthetic component well-seated.  No acute fracture dislocation.   Electronically Signed   By: Lowella Grip M.D.   On: 02/27/2014 10:47    Disposition: 01-Home or Self Care      Discharge Instructions   Call MD / Call 911    Complete by:  As directed   If you experience chest pain or shortness of breath, CALL 911 and be transported to the hospital emergency room.  If you develope a fever above 101 F, pus (white drainage) or increased drainage or redness at the wound, or calf pain, call your surgeon's office.     Constipation Prevention    Complete by:  As directed   Drink plenty of fluids.  Prune juice may be helpful.  You may use a stool softener, such as Colace (over the counter) 100 mg twice a day.  Use MiraLax (over the counter) for constipation as needed.     Diet - low sodium heart healthy    Complete by:  As directed      Increase activity slowly as tolerated    Complete by:  As directed            Follow-up  Information   Follow up with Nita Sells, MD. Schedule an appointment as soon as possible for a visit in 2 weeks.   Specialty:  Orthopedic Surgery   Contact information:   [place] [id number] [phone]        Signed: Grier Mitts 02/28/2014, 12:17 PM

## 2014-02-28 NOTE — Progress Notes (Signed)
   PATIENT ID: Jamse Arn   1 Day Post-Op Procedure(s) (LRB): LEFT TOTAL SHOULDER ARTHROPLASTY (Left)  Subjective: Reports pain overnight once block wore off, starting to get controlled now. Toradol inj overnight. No other complaints or concerns.  Objective:  Filed Vitals:   02/28/14 0641  BP: 95/51  Pulse:   Temp:   Resp:      Awake, alert, orientated  L UE dressing c/d/i Wiggles fingers, distally NVI Activates deltoid  Labs:  No results found for this basename: HGB,  in the last 72 hoursNo results found for this basename: WBC, RBC, HCT, PLT,  in the last 72 hoursNo results found for this basename: NA, K, CL, CO2, BUN, CREATININE, GLUCOSE, CALCIUM,  in the last 72 hours  Assessment and Plan: 1 day s/p left TSA Will work with OT today, FF140, ER40 degrees passive ROM D/c home today after OT and once pain under control Scripts in chart Follow up Dr. Tamera Punt in 2 weeks  VTE proph: ASA325mg  BID, SCDs

## 2014-02-28 NOTE — Progress Notes (Signed)
Pt states current pain medication is not effective.  On call, Gaspar Skeeters, PA notified.  New orders for Toradol 30 mg IV Q6h prn received.  Will continue to monitor patient.

## 2014-03-03 ENCOUNTER — Encounter (HOSPITAL_COMMUNITY): Payer: Self-pay | Admitting: Orthopedic Surgery

## 2015-11-07 IMAGING — CR DG PORTABLE PELVIS
1 series · 1 of 1 positions shown · non-contrast
Comparison: Portable exam 9091 hr compared to 08/18/2013

CLINICAL DATA: Post right hip replacement

EXAM:
PORTABLE PELVIS 1-2 VIEWS

[AP]
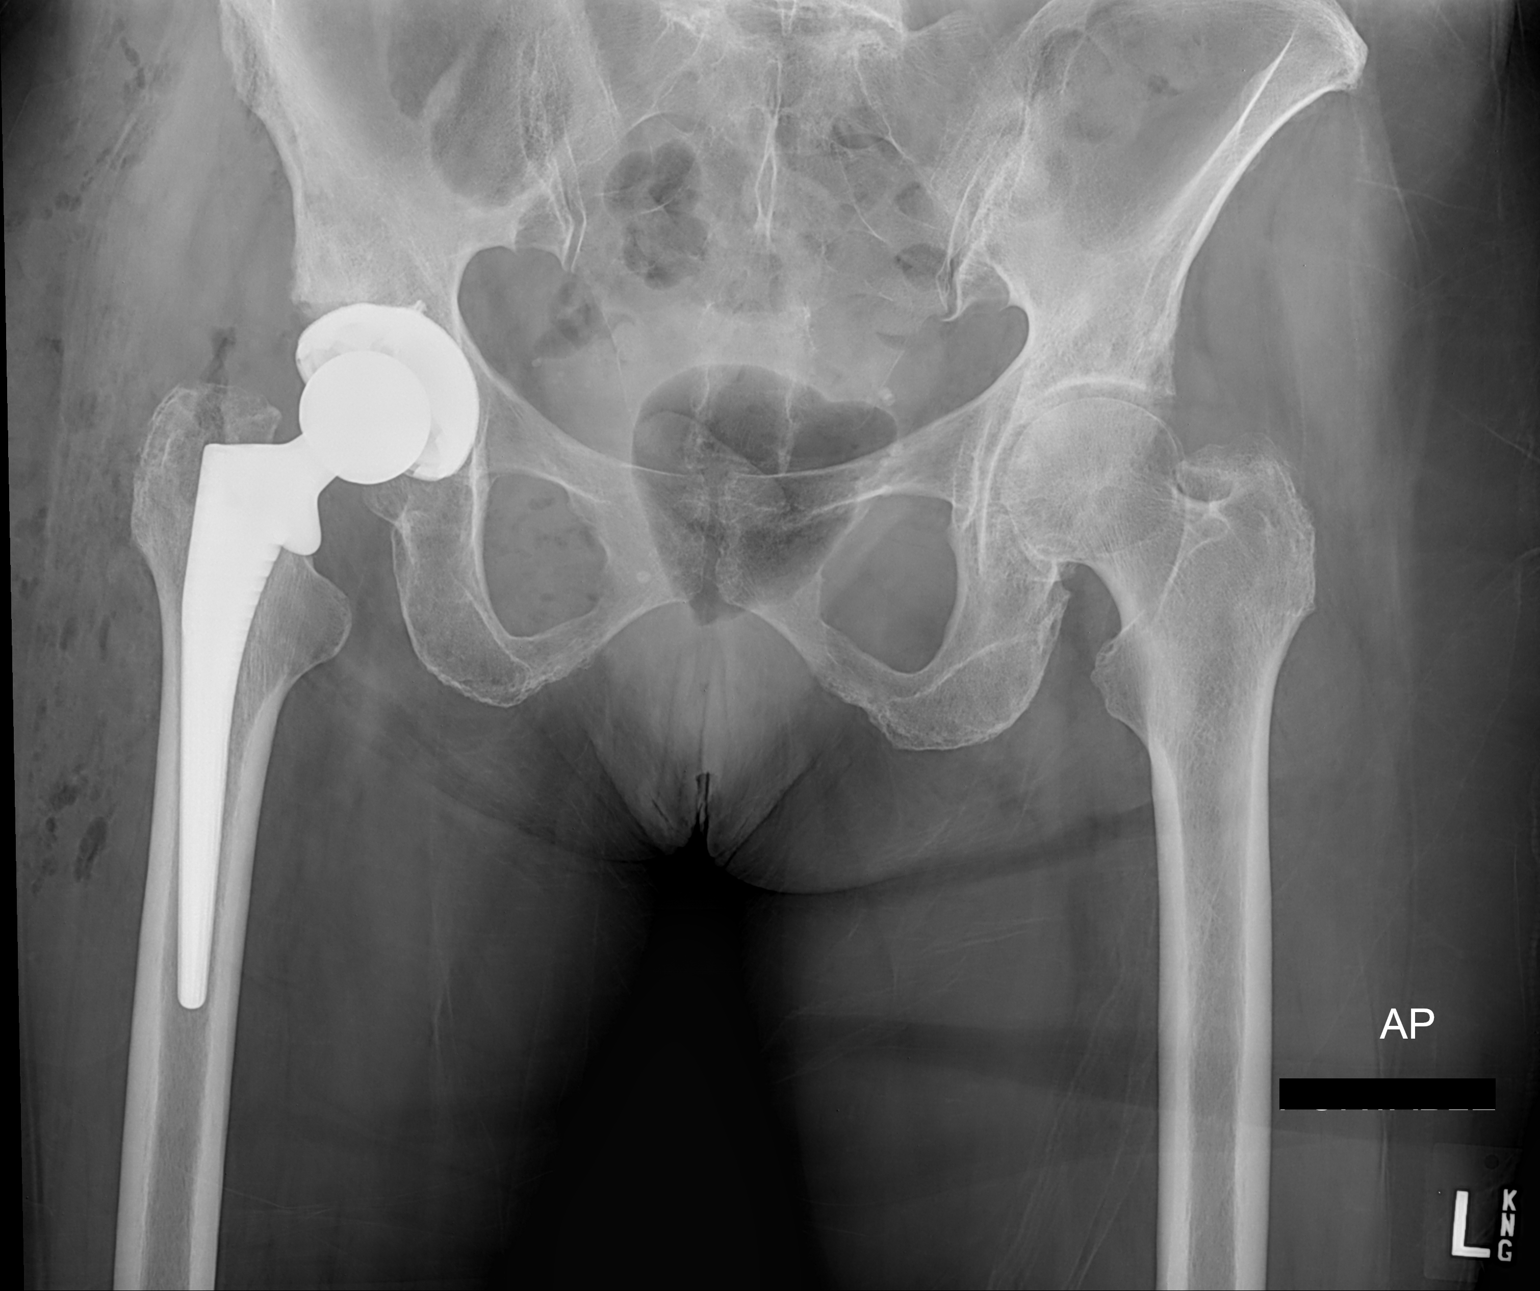

[1 of 1 positions shown; findings below may reference images not displayed]

FINDINGS: Acetabular and femoral components of right hip prosthesis are newly
identified.

No acute fracture or dislocation.

Diffuse osseous demineralization.

Pelvis appears intact.

Left hip joint space preserved.

Symmetric SI joints.

Expected postsurgical soft tissues changes laterally at the left hip
region.
IMPRESSION: Right hip prosthesis without acute complication.

## 2016-03-25 IMAGING — CR DG SHOULDER 1V*L*
1 series · 1 of 1 positions shown · non-contrast
Comparison: None.

CLINICAL DATA: Arthroplasty

EXAM:
PORTABLE LEFT SHOULDER -1 VIEW

[AP]
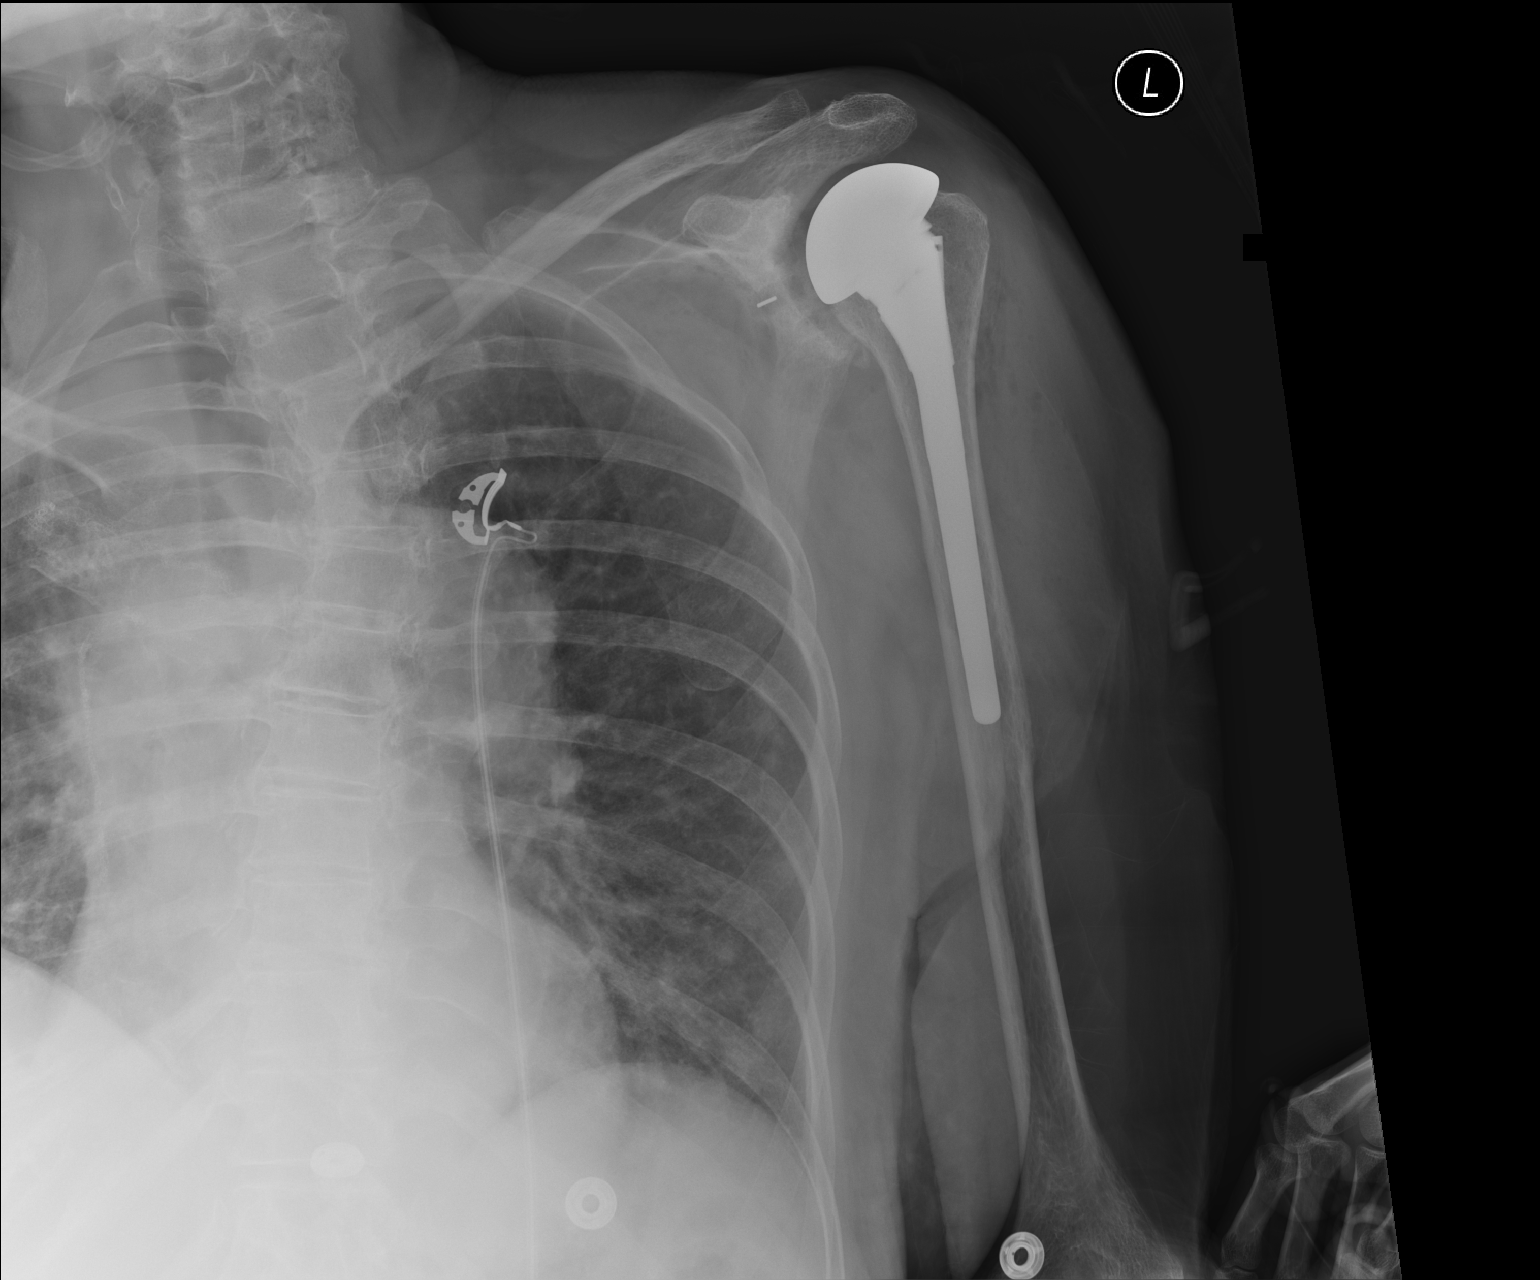

[1 of 1 positions shown; findings below may reference images not displayed]

FINDINGS: Patient is status post total shoulder replacement with prosthetic
component appearing well-seated. No acute fracture or dislocation.
IMPRESSION: Prosthetic component well-seated.  No acute fracture dislocation.

## 2016-11-03 DEATH — deceased
